# Patient Record
Sex: Female | Born: 1960 | Race: White | Hispanic: No | Smoking: Former smoker
Health system: Southern US, Community
[De-identification: ages and names within clinical notes are randomized; demographics above are authoritative.]

## PROBLEM LIST (undated history)

## (undated) DIAGNOSIS — J3081 Allergic rhinitis due to animal (cat) (dog) hair and dander: Secondary | ICD-10-CM

## (undated) DIAGNOSIS — Z8619 Personal history of other infectious and parasitic diseases: Secondary | ICD-10-CM

## (undated) DIAGNOSIS — Z5189 Encounter for other specified aftercare: Secondary | ICD-10-CM

## (undated) DIAGNOSIS — G43909 Migraine, unspecified, not intractable, without status migrainosus: Secondary | ICD-10-CM

## (undated) DIAGNOSIS — G8929 Other chronic pain: Secondary | ICD-10-CM

## (undated) DIAGNOSIS — R5383 Other fatigue: Secondary | ICD-10-CM

## (undated) DIAGNOSIS — F32A Depression, unspecified: Secondary | ICD-10-CM

## (undated) DIAGNOSIS — T7840XA Allergy, unspecified, initial encounter: Secondary | ICD-10-CM

## (undated) DIAGNOSIS — R519 Headache, unspecified: Secondary | ICD-10-CM

## (undated) DIAGNOSIS — F509 Eating disorder, unspecified: Secondary | ICD-10-CM

## (undated) DIAGNOSIS — D649 Anemia, unspecified: Secondary | ICD-10-CM

## (undated) DIAGNOSIS — R51 Headache: Secondary | ICD-10-CM

## (undated) DIAGNOSIS — F329 Major depressive disorder, single episode, unspecified: Secondary | ICD-10-CM

## (undated) DIAGNOSIS — R32 Unspecified urinary incontinence: Secondary | ICD-10-CM

## (undated) DIAGNOSIS — F419 Anxiety disorder, unspecified: Secondary | ICD-10-CM

## (undated) DIAGNOSIS — K589 Irritable bowel syndrome without diarrhea: Secondary | ICD-10-CM

## (undated) DIAGNOSIS — K219 Gastro-esophageal reflux disease without esophagitis: Secondary | ICD-10-CM

## (undated) HISTORY — DX: Migraine, unspecified, not intractable, without status migrainosus: G43.909

## (undated) HISTORY — DX: Headache, unspecified: R51.9

## (undated) HISTORY — DX: Major depressive disorder, single episode, unspecified: F32.9

## (undated) HISTORY — DX: Eating disorder, unspecified: F50.9

## (undated) HISTORY — DX: Headache: R51

## (undated) HISTORY — DX: Anemia, unspecified: D64.9

## (undated) HISTORY — DX: Irritable bowel syndrome, unspecified: K58.9

## (undated) HISTORY — DX: Encounter for other specified aftercare: Z51.89

## (undated) HISTORY — DX: Anxiety disorder, unspecified: F41.9

## (undated) HISTORY — DX: Depression, unspecified: F32.A

## (undated) HISTORY — DX: Other fatigue: R53.83

## (undated) HISTORY — DX: Personal history of other infectious and parasitic diseases: Z86.19

## (undated) HISTORY — DX: Unspecified urinary incontinence: R32

## (undated) HISTORY — DX: Gastro-esophageal reflux disease without esophagitis: K21.9

## (undated) HISTORY — DX: Allergic rhinitis due to animal (cat) (dog) hair and dander: J30.81

## (undated) HISTORY — DX: Allergy, unspecified, initial encounter: T78.40XA

## (undated) HISTORY — PX: DILATION AND CURETTAGE OF UTERUS: SHX78

## (undated) HISTORY — DX: Other chronic pain: G89.29

---

## 2003-05-21 ENCOUNTER — Other Ambulatory Visit: Admission: RE | Admit: 2003-05-21 | Discharge: 2003-05-21 | Payer: Self-pay | Admitting: *Deleted

## 2006-03-17 ENCOUNTER — Encounter: Admission: RE | Admit: 2006-03-17 | Discharge: 2006-03-17 | Payer: Self-pay | Admitting: Neurology

## 2008-07-13 ENCOUNTER — Ambulatory Visit (HOSPITAL_COMMUNITY): Admission: RE | Admit: 2008-07-13 | Discharge: 2008-07-13 | Payer: Self-pay | Admitting: Internal Medicine

## 2008-12-06 ENCOUNTER — Encounter (HOSPITAL_COMMUNITY): Admission: RE | Admit: 2008-12-06 | Discharge: 2009-03-06 | Payer: Self-pay | Admitting: Internal Medicine

## 2009-12-16 ENCOUNTER — Encounter: Admission: RE | Admit: 2009-12-16 | Discharge: 2009-12-16 | Payer: Self-pay | Admitting: Neurology

## 2011-01-27 LAB — CROSSMATCH: Antibody Screen: NEGATIVE

## 2011-01-27 LAB — ABO/RH: ABO/RH(D): B POS

## 2011-10-13 HISTORY — PX: OTHER SURGICAL HISTORY: SHX169

## 2012-12-30 ENCOUNTER — Encounter: Payer: Self-pay | Admitting: Internal Medicine

## 2013-01-12 ENCOUNTER — Encounter: Payer: Self-pay | Admitting: Internal Medicine

## 2013-01-17 ENCOUNTER — Encounter: Payer: Self-pay | Admitting: Internal Medicine

## 2013-01-17 ENCOUNTER — Ambulatory Visit (INDEPENDENT_AMBULATORY_CARE_PROVIDER_SITE_OTHER): Payer: Federal, State, Local not specified - PPO | Admitting: Internal Medicine

## 2013-01-17 VITALS — BP 118/66 | HR 75 | Ht 68.0 in | Wt 267.0 lb

## 2013-01-17 DIAGNOSIS — R1011 Right upper quadrant pain: Secondary | ICD-10-CM

## 2013-01-17 DIAGNOSIS — R1310 Dysphagia, unspecified: Secondary | ICD-10-CM

## 2013-01-17 DIAGNOSIS — F339 Major depressive disorder, recurrent, unspecified: Secondary | ICD-10-CM | POA: Insufficient documentation

## 2013-01-17 DIAGNOSIS — K589 Irritable bowel syndrome without diarrhea: Secondary | ICD-10-CM

## 2013-01-17 DIAGNOSIS — G43009 Migraine without aura, not intractable, without status migrainosus: Secondary | ICD-10-CM | POA: Insufficient documentation

## 2013-01-17 DIAGNOSIS — Z1211 Encounter for screening for malignant neoplasm of colon: Secondary | ICD-10-CM

## 2013-01-17 MED ORDER — ALIGN PO CAPS: 1.0000 | ORAL_CAPSULE | Freq: Every day | ORAL | Status: DC

## 2013-01-17 MED ORDER — PEG-KCL-NACL-NASULF-NA ASC-C 100 G PO SOLR: 1.0000 | Freq: Once | ORAL | Status: DC

## 2013-01-17 MED ORDER — PANTOPRAZOLE SODIUM 40 MG PO TBEC
40.0000 mg | DELAYED_RELEASE_TABLET | Freq: Every day | ORAL | Status: DC
Start: 2013-01-17 — End: 2014-03-01

## 2013-01-17 NOTE — Patient Instructions (Addendum)
You have been scheduled for a colonoscopy/endoscopy with propofol. Please follow written instructions given to you at your visit today.  Please pick up your prep kit at the pharmacy within the next 1-3 days. If you use inhalers (even only as needed), please bring them with you on the day of your procedure.  We have sent the following medications to your pharmacy for you to pick up at your convenience: Pantoprazole, Align  We have given you samples of Align. This puts good bacteria back into your intestines. You should take 1 capsule by mouth once daily. If this works well for you, it can be purchased over the counter.                                                We are excited to introduce MyChart, a new best-in-class service that provides you online access to important information in your electronic medical record. We want to make it easier for you to view your health information - all in one secure location - when and where you need it. We expect MyChart will enhance the quality of care and service we provide.  When you register for MyChart, you can:    View your test results.    Request appointments and receive appointment reminders via email.    Request medication renewals.    View your medical history, allergies, medications and immunizations.    Communicate with your physician's office through a password-protected site.    Conveniently print information such as your medication lists.  To find out if MyChart is right for you, please talk to a member of our clinical staff today. We will gladly answer your questions about this free health and wellness tool.  If you are age 86 or older and want a member of your family to have access to your record, you must provide written consent by completing a proxy form available at our office. Please speak to our clinical staff about guidelines regarding accounts for patients younger than age 55.  As you activate your MyChart account and need  any technical assistance, please call the MyChart technical support line at [phone]) or email your question to [email or web address]. If you email your question(s), please include your name, a return phone number and the best time to reach you.  If you have non-urgent health-related questions, you can send a message to our office through MyChart at [email or web address]. If you have a medical emergency, call 911.  Thank you for using MyChart as your new health and wellness resource!   MyChart licensed from [employer],  2956-2130. Patents Pending.

## 2013-01-17 NOTE — Progress Notes (Signed)
Patient ID: FLOREEN TEEGARDEN, female   DOB: June 25, 1961, 52 y.o.   MRN: [id number] HPI: Mrs. Roppolo is a 52 year old female with a past medical history of anxiety depression, chronic headaches and migraines who is seen in consultation at the request of Dr. Oneta Rack for evaluation of dysphagia, upper abd pain.  The patient reports one year of increasing solid food dysphagia. She has no trouble with liquids. This is worse with foods such as meats and apples. She reports on and off heartburn, but does not take anything specifically for this. She was prescribed Nexium recently by PCP but never started it. She also reports intermittent trouble with nausea. This bothers her it can last one to 2 weeks. Is not associated with vomiting. She also reports epigastric and upper abdominal pain which she described as an aching.  This is specifically worse with food, particularly fatty food. This symptom is often worse at night. It has woken her from sleep.  She reports a long-standing history of intermittent and alternating diarrhea with constipation. Neither diarrhea nor constipation are predominant for her. She denies rectal bleeding or melena. At times she has lower back cramping before bowel movements, often relieved with bowel movements. She does take NSAIDs for migraine headaches. No fevers or chills. No weight loss.  She has never had an endoscopic procedure  Of note she reports a history of brain lesions being followed by serial MRI. The diagnosis of MS has been considered but never formally made. She reports these lesions have been stable over time  Past Medical History  Diagnosis Date  . allergies   . Depression   . Irritable bowel syndrome (IBS)   . Fatigue   . Anemia   . Anxiety   . Chronic headaches     Past Surgical History  Procedure Laterality Date  . Cesarean section  1996    Current Outpatient Prescriptions  Medication Sig Dispense Refill  . cholecalciferol (VITAMIN D) 1000 UNITS tablet Take  1,000 Units by mouth 2 (two) times daily.      . fish oil-omega-3 fatty acids 1000 MG capsule Take 2 g by mouth daily.      Marland Kitchen FLUoxetine (PROZAC) 40 MG capsule Take 40 mg by mouth daily.      Marland Kitchen guaiFENesin (MUCINEX) 600 MG 12 hr tablet Take 1,200 mg by mouth as needed for congestion.      Marland Kitchen loratadine (CLARITIN) 10 MG tablet Take 10 mg by mouth daily.      . mometasone (NASONEX) 50 MCG/ACT nasal spray Place 2 sprays into the nose daily.      Marland Kitchen topiramate (TOPAMAX) 100 MG tablet Take 50 mg by mouth daily.       . bifidobacterium infantis (ALIGN) capsule Take 1 capsule by mouth daily.  14 capsule  0  . pantoprazole (PROTONIX) 40 MG tablet Take 1 tablet (40 mg total) by mouth daily.  30 tablet  6  . peg 3350 powder (MOVIPREP) 100 G SOLR Take 1 kit (100 g total) by mouth once.  1 kit  0   No current facility-administered medications for this visit.    Allergies  Allergen Reactions  . Penicillins Rash    Family History  Problem Relation Age of Onset  . Heart disease Father   . Heart disease Paternal Aunt   . Heart disease Paternal Grandmother   . Lung cancer Paternal Grandfather   . Lung cancer Maternal Grandfather   . Lung cancer Paternal Grandmother   . Kidney cancer  Maternal Grandmother     History  Substance Use Topics  . Smoking status: Former Games developer  . Smokeless tobacco: Never Used  . Alcohol Use: No    ROS: As per history of present illness, otherwise negative  BP 118/66  Pulse 75  Ht 5\' 8"  (1.727 m)  Wt 267 lb (121.11 kg)  BMI 40.61 kg/m2  LMP 09/25/2012 Constitutional: Well-developed and well-nourished. No distress. HEENT: Normocephalic and atraumatic. Oropharynx is clear and moist. No oropharyngeal exudate. Conjunctivae are normal.  No scleral icterus. Neck: Neck supple. Trachea midline. Cardiovascular: Normal rate, regular rhythm and intact distal pulses. No M/R/G Pulmonary/chest: Effort normal and breath sounds normal. No wheezing, rales or  rhonchi. Abdominal: Soft, nontender, nondistended. Bowel sounds active throughout. There are no masses palpable. No hepatosplenomegaly. Extremities: no clubbing, cyanosis, or edema Lymphadenopathy: No cervical adenopathy noted. Neurological: Alert and oriented to person place and time. Skin: Skin is warm and dry. No rashes noted. Psychiatric: Normal mood and affect. Behavior is normal.  RELEVANT LABS  Labs dated 10/28/2012 CBC within normal limits Basic metabolic panel within normal limits Liver profile within normal limits TSH normal H pylori antibody negative Hemoglobin A1c 5.6 Vitamin D low at 13 UA unremarkable  ASSESSMENT/PLAN:  52 year old female with a past medical history of anxiety depression, chronic headaches and migraines who is seen in consultation at the request of Dr. Oneta Rack for evaluation of dysphagia, upper abd pain.   1.  Epigastric abdominal pain/nausea -- peptic ulcer disease is in the differential along with biliary colic. She never started her PPI, and I recommend a trial of pantoprazole 40 mg daily. She is instructed to take this 30 minutes to one hour before her first meal of the day.  Also recommended upper endoscopy for further evaluation of this pain. We discussed the tests today including risks and benefits and she is agreeable to proceed. If her upper endoscopy is negative, I recommend ultrasound to exclude biliary pathology such as stones  2.  Alternating diarrhea and constipation/IBS -- many of her symptoms are consistent with irritable bowel syndrome. I like her to try Align 1 capsule daily as a probiotic to help regulate her digestion. Rule out celiac disease with her upcoming upper endoscopy. Also, she will have a colonoscopy for screening, see #3.  If colonoscopy is normal, I would consider an anti-spasmodic and/or FODMAP diet  3.  CRC screening -- she is 52 years old, and average risk from colorectal cancer screening standpoint. We discussed colonoscopy  today and she is agreeable to proceed. This will be performed on the same day as her endoscopy

## 2013-02-09 ENCOUNTER — Encounter: Payer: Federal, State, Local not specified - PPO | Admitting: Internal Medicine

## 2013-05-19 ENCOUNTER — Emergency Department: Payer: Self-pay | Admitting: Emergency Medicine

## 2013-05-22 ENCOUNTER — Telehealth: Payer: Self-pay | Admitting: Gastroenterology

## 2013-05-22 ENCOUNTER — Other Ambulatory Visit: Payer: Self-pay | Admitting: Gastroenterology

## 2013-05-22 DIAGNOSIS — R1011 Right upper quadrant pain: Secondary | ICD-10-CM

## 2013-05-22 DIAGNOSIS — Z1211 Encounter for screening for malignant neoplasm of colon: Secondary | ICD-10-CM

## 2013-05-22 DIAGNOSIS — R1310 Dysphagia, unspecified: Secondary | ICD-10-CM

## 2013-05-22 NOTE — Telephone Encounter (Signed)
Pt needs prior authorization for pantoprazole. Faxed in for to [facility] [phone]

## 2014-03-01 ENCOUNTER — Ambulatory Visit (INDEPENDENT_AMBULATORY_CARE_PROVIDER_SITE_OTHER): Payer: Federal, State, Local not specified - PPO | Admitting: Family Medicine

## 2014-03-01 ENCOUNTER — Encounter (INDEPENDENT_AMBULATORY_CARE_PROVIDER_SITE_OTHER): Payer: Self-pay

## 2014-03-01 ENCOUNTER — Encounter: Payer: Self-pay | Admitting: Family Medicine

## 2014-03-01 VITALS — BP 116/70 | HR 77 | Temp 98.5°F | Ht 66.46 in | Wt 268.2 lb

## 2014-03-01 DIAGNOSIS — N393 Stress incontinence (female) (male): Secondary | ICD-10-CM

## 2014-03-01 DIAGNOSIS — D219 Benign neoplasm of connective and other soft tissue, unspecified: Secondary | ICD-10-CM

## 2014-03-01 DIAGNOSIS — R0600 Dyspnea, unspecified: Secondary | ICD-10-CM

## 2014-03-01 DIAGNOSIS — J309 Allergic rhinitis, unspecified: Secondary | ICD-10-CM | POA: Insufficient documentation

## 2014-03-01 DIAGNOSIS — Z13220 Encounter for screening for lipoid disorders: Secondary | ICD-10-CM

## 2014-03-01 DIAGNOSIS — R0609 Other forms of dyspnea: Secondary | ICD-10-CM | POA: Insufficient documentation

## 2014-03-01 DIAGNOSIS — Z8249 Family history of ischemic heart disease and other diseases of the circulatory system: Secondary | ICD-10-CM

## 2014-03-01 DIAGNOSIS — K219 Gastro-esophageal reflux disease without esophagitis: Secondary | ICD-10-CM

## 2014-03-01 DIAGNOSIS — R5383 Other fatigue: Secondary | ICD-10-CM

## 2014-03-01 DIAGNOSIS — F411 Generalized anxiety disorder: Secondary | ICD-10-CM

## 2014-03-01 DIAGNOSIS — F410 Panic disorder [episodic paroxysmal anxiety] without agoraphobia: Secondary | ICD-10-CM

## 2014-03-01 DIAGNOSIS — D259 Leiomyoma of uterus, unspecified: Secondary | ICD-10-CM

## 2014-03-01 DIAGNOSIS — G43009 Migraine without aura, not intractable, without status migrainosus: Secondary | ICD-10-CM

## 2014-03-01 DIAGNOSIS — F339 Major depressive disorder, recurrent, unspecified: Secondary | ICD-10-CM

## 2014-03-01 DIAGNOSIS — R0989 Other specified symptoms and signs involving the circulatory and respiratory systems: Secondary | ICD-10-CM

## 2014-03-01 DIAGNOSIS — R5381 Other malaise: Secondary | ICD-10-CM

## 2014-03-01 NOTE — Progress Notes (Signed)
Pre visit review using our clinic review tool, if applicable. No additional management support is needed unless otherwise documented below in the visit note. 

## 2014-03-01 NOTE — Progress Notes (Signed)
   Subjective:    Patient ID: Lori Kaiser, female    DOB: 07-30-1961, 53 y.o.   MRN: [id number]  HPI 53 year old female presents to establish care at our office.  She was seeing Dr. Johnnye Sima in past. Last OV  over 1-2 years for CPX.   She had seen GYN over 5 years for CPX. Plans to return.  Due for Tdap, colonoscopy, mammo.  GAD, major depression, panic attacks since 20s. Well controlled on prozac. Prescribed by her neurologist.  Common migraine: Followed by Dr. Nancy Nordmann , neuro. Appts every 3-4 months.  On topamax for prevention, uses flurbiprofen for rescue.  Now ongoing 1-2 per month.  GERD: well controlled on trigger avoidance  Father with CAD age 60s.  Last EKG 15 years ago. She has never had stress test in past. No chest pain, DOE given she is deconditioned. No palpitations.  She has fear given her fathers history that she will have MI.  Fatigue ongoing for last 6-8 months.  She has soreness in legs bilaterally, not in joints. She feels it is from lack of exercise.   Review of Systems  Constitutional: Positive for fatigue.  HENT: Negative for ear pain.   Eyes: Negative for pain.  Respiratory: Positive for shortness of breath. Negative for cough and wheezing.   Cardiovascular: Negative for chest pain.  Gastrointestinal: Negative for abdominal pain.       Objective:   Physical Exam        Assessment & Plan:

## 2014-03-01 NOTE — Assessment & Plan Note (Signed)
Moderate control on prozac.

## 2014-03-01 NOTE — Patient Instructions (Addendum)
Schedule  wellness, no pap/breast with labs prior in next month. Schedule appt with GYN as planned for breast and pelvic exam.  We will call with lab results and consider exercise stress test.

## 2014-03-01 NOTE — Assessment & Plan Note (Signed)
Eval with labs. 

## 2014-03-01 NOTE — Assessment & Plan Note (Addendum)
Eval with labs. Will eval with EKG given familiy hx of early CAD. NORMAL EKG.  Consider exercise stress test if labs neg for work up and to clear her for exercise.

## 2014-03-01 NOTE — Assessment & Plan Note (Signed)
Well controlled 

## 2014-03-01 NOTE — Addendum Note (Signed)
Addended by: Ellamae Sia on: 03/01/2014 10:05 AM   Modules accepted: Orders

## 2014-03-06 ENCOUNTER — Other Ambulatory Visit (INDEPENDENT_AMBULATORY_CARE_PROVIDER_SITE_OTHER): Payer: Federal, State, Local not specified - PPO

## 2014-03-06 DIAGNOSIS — R5381 Other malaise: Secondary | ICD-10-CM

## 2014-03-06 DIAGNOSIS — Z13220 Encounter for screening for lipoid disorders: Secondary | ICD-10-CM

## 2014-03-06 DIAGNOSIS — F339 Major depressive disorder, recurrent, unspecified: Secondary | ICD-10-CM

## 2014-03-06 DIAGNOSIS — R5383 Other fatigue: Secondary | ICD-10-CM

## 2014-03-06 DIAGNOSIS — F411 Generalized anxiety disorder: Secondary | ICD-10-CM

## 2014-03-06 DIAGNOSIS — G43009 Migraine without aura, not intractable, without status migrainosus: Secondary | ICD-10-CM

## 2014-03-06 LAB — CBC WITH DIFFERENTIAL/PLATELET
BASOS ABS: 0 10*3/uL (ref 0.0–0.1)
BASOS PCT: 0.6 % (ref 0.0–3.0)
EOS PCT: 4.5 % (ref 0.0–5.0)
Eosinophils Absolute: 0.3 10*3/uL (ref 0.0–0.7)
HCT: 38.5 % (ref 36.0–46.0)
HEMOGLOBIN: 12.8 g/dL (ref 12.0–15.0)
LYMPHS ABS: 1.9 10*3/uL (ref 0.7–4.0)
LYMPHS PCT: 23.8 % (ref 12.0–46.0)
MCHC: 33.2 g/dL (ref 30.0–36.0)
MCV: 86.2 fl (ref 78.0–100.0)
MONO ABS: 0.4 10*3/uL (ref 0.1–1.0)
Monocytes Relative: 4.8 % (ref 3.0–12.0)
Neutro Abs: 5.2 10*3/uL (ref 1.4–7.7)
Neutrophils Relative %: 66.3 % (ref 43.0–77.0)
Platelets: 279 10*3/uL (ref 150.0–400.0)
RBC: 4.47 Mil/uL (ref 3.87–5.11)
RDW: 13.8 % (ref 11.5–15.5)
WBC: 7.8 10*3/uL (ref 4.0–10.5)

## 2014-03-06 LAB — COMPREHENSIVE METABOLIC PANEL
ALK PHOS: 105 U/L (ref 39–117)
ALT: 25 U/L (ref 0–35)
AST: 23 U/L (ref 0–37)
Albumin: 3.6 g/dL (ref 3.5–5.2)
BUN: 8 mg/dL (ref 6–23)
CALCIUM: 9.3 mg/dL (ref 8.4–10.5)
CO2: 23 mEq/L (ref 19–32)
Chloride: 105 mEq/L (ref 96–112)
Creatinine, Ser: 0.9 mg/dL (ref 0.4–1.2)
GFR: 74.89 mL/min (ref >60.00)
GLUCOSE: 97 mg/dL (ref 70–99)
Potassium: 3.8 mEq/L (ref 3.5–5.1)
SODIUM: 138 meq/L (ref 135–145)
Total Bilirubin: 0.5 mg/dL (ref 0.2–1.2)
Total Protein: 7 g/dL (ref 6.0–8.3)

## 2014-03-06 LAB — LIPID PANEL
CHOL/HDL RATIO: 4
Cholesterol: 181 mg/dL (ref 0–200)
HDL: 47.3 mg/dL (ref >39.00)
LDL CALC: 99 mg/dL (ref 0–99)
Triglycerides: 172 mg/dL — ABNORMAL HIGH (ref 0.0–149.0)
VLDL: 34.4 mg/dL (ref 0.0–40.0)

## 2014-03-06 LAB — VITAMIN B12: Vitamin B-12: 275 pg/mL (ref 211–911)

## 2014-03-06 LAB — TSH: TSH: 1.89 u[IU]/mL (ref 0.35–4.50)

## 2014-03-07 LAB — VITAMIN D 25 HYDROXY (VIT D DEFICIENCY, FRACTURES): VIT D 25 HYDROXY: 32 ng/mL (ref 30–89)

## 2014-03-30 ENCOUNTER — Other Ambulatory Visit: Payer: Federal, State, Local not specified - PPO

## 2014-04-06 ENCOUNTER — Ambulatory Visit (INDEPENDENT_AMBULATORY_CARE_PROVIDER_SITE_OTHER): Payer: Federal, State, Local not specified - PPO | Admitting: Family Medicine

## 2014-04-06 ENCOUNTER — Encounter: Payer: Self-pay | Admitting: Family Medicine

## 2014-04-06 VITALS — BP 112/62 | HR 72 | Temp 98.8°F | Ht 66.5 in | Wt 268.8 lb

## 2014-04-06 DIAGNOSIS — R0609 Other forms of dyspnea: Secondary | ICD-10-CM

## 2014-04-06 DIAGNOSIS — Z0000 Encounter for general adult medical examination without abnormal findings: Secondary | ICD-10-CM

## 2014-04-06 DIAGNOSIS — R5383 Other fatigue: Secondary | ICD-10-CM

## 2014-04-06 DIAGNOSIS — R0600 Dyspnea, unspecified: Secondary | ICD-10-CM

## 2014-04-06 DIAGNOSIS — R5381 Other malaise: Secondary | ICD-10-CM

## 2014-04-06 DIAGNOSIS — R0989 Other specified symptoms and signs involving the circulatory and respiratory systems: Secondary | ICD-10-CM

## 2014-04-06 NOTE — Progress Notes (Signed)
Subjective:    Patient ID: Lori Kaiser, female    DOB: 04/17/1961, 53 y.o.   MRN: [id number]  HPI The patient is here for annual wellness exam and preventative care.     GAD, major depression, panic attacks since 20s. Well controlled on prozac. Prescribed by her neurologist.   Common migraine: Followed by Dr. Nancy Nordmann , neuro. Appts every 3-4 months. On topamax for prevention, uses flurbiprofen for rescue. Now ongoing 1-2 per month.   GERD: well controlled on trigger avoidance   Father with CAD age 9s. Last EKG 15 years ago. She has never had stress test in past.  No chest pain, DOE given she is deconditioned.  No palpitations.  She has fear given her fathers history that she will have MI.   Reviewed labs in detail with pt.  Lab Results  Component Value Date   CHOL 181 03/06/2014   HDL 47.30 03/06/2014   LDLCALC 99 03/06/2014   TRIG 172.0* 03/06/2014   CHOLHDL 4 03/06/2014     Fatigue ongoing for last 6-8 months.  DOE EKG neg, nml CMET, TSH, vit D, vit B12  at last OV 1 month ago for theses issues.  She reports she has been feeling better overall, more energy. NO DOE, this was from deconditioning.  She has less " brain fog."  She has been exercising 1 mile a day on treadmill. She has improved leg soreness. Wt Readings from Last 3 Encounters:  04/06/14 268 lb 12 oz (121.904 kg)  03/01/14 268 lb 4 oz (121.677 kg)  01/17/13 267 lb (121.11 kg)   BP Readings from Last 3 Encounters:  04/06/14 112/62  03/01/14 116/70  01/17/13 118/66      Review of Systems  Constitutional: Negative for fever and fatigue.  HENT: Negative for ear pain.   Eyes: Negative for pain.  Respiratory: Negative for chest tightness and shortness of breath.   Cardiovascular: Negative for chest pain, palpitations and leg swelling.  Gastrointestinal: Negative for abdominal pain.  Genitourinary: Negative for dysuria.       Objective:   Physical Exam  Constitutional: Vital signs are normal.  She appears well-developed and well-nourished. She is cooperative.  Non-toxic appearance. She does not appear ill. No distress.  obese  HENT:  Head: Normocephalic.  Right Ear: Hearing, tympanic membrane, external ear and ear canal normal. Tympanic membrane is not erythematous, not retracted and not bulging.  Left Ear: Hearing, tympanic membrane, external ear and ear canal normal. Tympanic membrane is not erythematous, not retracted and not bulging.  Nose: No mucosal edema or rhinorrhea. Right sinus exhibits no maxillary sinus tenderness and no frontal sinus tenderness. Left sinus exhibits no maxillary sinus tenderness and no frontal sinus tenderness.  Mouth/Throat: Uvula is midline, oropharynx is clear and moist and mucous membranes are normal.  Eyes: Conjunctivae, EOM and lids are normal. Pupils are equal, round, and reactive to light. Lids are everted and swept, no foreign bodies found.  Neck: Trachea normal and normal range of motion. Neck supple. Carotid bruit is not present. No mass and no thyromegaly present.  Cardiovascular: Normal rate, regular rhythm, S1 normal, S2 normal, normal heart sounds, intact distal pulses and normal pulses.  Exam reveals no gallop and no friction rub.   No murmur heard. Pulmonary/Chest: Effort normal and breath sounds normal. Not tachypneic. No respiratory distress. She has no decreased breath sounds. She has no wheezes. She has no rhonchi. She has no rales.  Abdominal: Soft. Normal appearance and bowel sounds  are normal. There is no tenderness.  Genitourinary:  Per GYN  Neurological: She is alert.  Skin: Skin is warm, dry and intact. No rash noted.  Psychiatric: Her speech is normal and behavior is normal. Judgment and thought content normal. Her mood appears not anxious. Cognition and memory are normal. She does not exhibit a depressed mood.          Assessment & Plan:  The patient's preventative maintenance and recommended screening tests for an annual  wellness exam were reviewed in full today. Brought up to date unless services declined.  Counselled on the importance of diet, exercise, and its role in overall health and mortality. The patient's FH and SH was reviewed, including their home life, tobacco status, and drug and alcohol status.    Due for Tdap she will consider. She has never had a colonoscopy,  She plans to schedule with Dr. Hilarie Fredrickson. Sees Dr. Ronita Hipps for mammo/DVE/pap Former smoker for 12 years, Quit remotely. No early osteoporosis.

## 2014-04-06 NOTE — Assessment & Plan Note (Signed)
Resolved with exercise. Lab eval normal.

## 2014-04-06 NOTE — Patient Instructions (Signed)
Schedule CPX with labs prior in 1 year.  keep up the great work with healthy eating and exercise!!

## 2014-04-06 NOTE — Progress Notes (Signed)
Pre visit review using our clinic review tool, if applicable. No additional management support is needed unless otherwise documented below in the visit note. 

## 2014-04-06 NOTE — Assessment & Plan Note (Signed)
Resolved with exercise

## 2014-11-23 ENCOUNTER — Ambulatory Visit (INDEPENDENT_AMBULATORY_CARE_PROVIDER_SITE_OTHER): Payer: Federal, State, Local not specified - PPO | Admitting: Family Medicine

## 2014-11-23 ENCOUNTER — Encounter: Payer: Self-pay | Admitting: Family Medicine

## 2014-11-23 VITALS — BP 134/87 | HR 95 | Temp 98.4°F | Ht 66.5 in | Wt 287.5 lb

## 2014-11-23 DIAGNOSIS — J029 Acute pharyngitis, unspecified: Secondary | ICD-10-CM

## 2014-11-23 DIAGNOSIS — B9789 Other viral agents as the cause of diseases classified elsewhere: Principal | ICD-10-CM

## 2014-11-23 DIAGNOSIS — R509 Fever, unspecified: Secondary | ICD-10-CM

## 2014-11-23 DIAGNOSIS — J069 Acute upper respiratory infection, unspecified: Secondary | ICD-10-CM | POA: Insufficient documentation

## 2014-11-23 LAB — POCT INFLUENZA A/B
Influenza A, POC: NEGATIVE
Influenza B, POC: NEGATIVE

## 2014-11-23 LAB — POCT RAPID STREP A (OFFICE): Rapid Strep A Screen: NEGATIVE

## 2014-11-23 MED ORDER — GUAIFENESIN-CODEINE 100-10 MG/5ML PO SYRP: 5.0000 mL | ORAL_SOLUTION | Freq: Every evening | ORAL | Status: DC | PRN

## 2014-11-23 NOTE — Progress Notes (Signed)
   Subjective:    Patient ID: Lori Kaiser, female    DOB: 1961-08-21, 54 y.o.   MRN: [id number]  HPI Comments: Mild SOB.  URI  This is a new problem. The current episode started in the past 7 days (2 DAYS). The maximum temperature recorded prior to her arrival was 102 - 102.9 F. The fever has been present for 1 to 2 days. Associated symptoms include coughing, ear pain, headaches, a plugged ear sensation and a sore throat. Pertinent negatives include no chest pain, congestion, dysuria, neck pain, rash, sinus pain, sneezing or wheezing. Associated symptoms comments: Wheeze, mild SOB  right sore throat and right ear. She has tried NSAIDs (mucinex) for the symptoms. The treatment provided moderate relief.  Fever  Associated symptoms include coughing, ear pain, headaches and a sore throat. Pertinent negatives include no chest pain, congestion, rash, urinary pain or wheezing.   Son with strep recently.  Review of Systems  Constitutional: Positive for fever.  HENT: Positive for ear pain and sore throat. Negative for congestion and sneezing.   Respiratory: Positive for cough. Negative for wheezing.   Cardiovascular: Negative for chest pain.  Genitourinary: Negative for dysuria.  Musculoskeletal: Negative for neck pain.  Skin: Negative for rash.  Neurological: Positive for headaches.       Objective:   Physical Exam  Constitutional: Vital signs are normal. She appears well-developed and well-nourished. She is cooperative.  Non-toxic appearance. She does not appear ill. No distress.  HENT:  Head: Normocephalic.  Right Ear: Hearing, external ear and ear canal normal. Tympanic membrane is not erythematous, not retracted and not bulging. A middle ear effusion is present.  Left Ear: Hearing, external ear and ear canal normal. Tympanic membrane is not erythematous, not retracted and not bulging. A middle ear effusion is present.  Nose: Mucosal edema and rhinorrhea present. Right sinus exhibits no  maxillary sinus tenderness and no frontal sinus tenderness. Left sinus exhibits no maxillary sinus tenderness and no frontal sinus tenderness.  Mouth/Throat: Uvula is midline and mucous membranes are normal. Posterior oropharyngeal erythema present. No oropharyngeal exudate, posterior oropharyngeal edema or tonsillar abscesses.  Eyes: Conjunctivae, EOM and lids are normal. Pupils are equal, round, and reactive to light. Lids are everted and swept, no foreign bodies found.  Neck: Trachea normal and normal range of motion. Neck supple. Carotid bruit is not present. No thyroid mass and no thyromegaly present.  Cardiovascular: Normal rate, regular rhythm, S1 normal, S2 normal, normal heart sounds, intact distal pulses and normal pulses.  Exam reveals no gallop and no friction rub.   No murmur heard. Pulmonary/Chest: Effort normal and breath sounds normal. No tachypnea. No respiratory distress. She has no decreased breath sounds. She has no wheezes. She has no rhonchi. She has no rales.  Neurological: She is alert.  Skin: Skin is warm, dry and intact. No rash noted.  Psychiatric: Her speech is normal and behavior is normal. Judgment normal. Her mood appears not anxious. Cognition and memory are normal. She does not exhibit a depressed mood.          Assessment & Plan:

## 2014-11-23 NOTE — Addendum Note (Signed)
Addended by: Carter Kitten on: 11/23/2014 12:34 PM   Modules accepted: Orders

## 2014-11-23 NOTE — Progress Notes (Signed)
Pre visit review using our clinic review tool, if applicable. No additional management support is needed unless otherwise documented below in the visit note. 

## 2014-11-23 NOTE — Assessment & Plan Note (Signed)
Neg strep (performed given recent exposure) and flu. Treat with mucinex DM in day, cough suppressant at night.  Rest. Fluids.  NSAIDs for fever.  Call if not improving in next 7-10 days, go to ER if severe shortness of breath.

## 2014-11-23 NOTE — Patient Instructions (Signed)
Treat with mucinex DM in day,  Prescription cough suppressant at night.  Rest. Fluids.  NSAIDs for fever.  Call if not improving in next 7-10 days, go to ER if severe shortness of breath.

## 2014-12-12 ENCOUNTER — Encounter: Payer: Self-pay | Admitting: Internal Medicine

## 2014-12-12 ENCOUNTER — Ambulatory Visit (INDEPENDENT_AMBULATORY_CARE_PROVIDER_SITE_OTHER): Payer: Federal, State, Local not specified - PPO | Admitting: Internal Medicine

## 2014-12-12 VITALS — BP 138/78 | HR 102 | Temp 98.8°F | Wt 286.0 lb

## 2014-12-12 DIAGNOSIS — R05 Cough: Secondary | ICD-10-CM

## 2014-12-12 DIAGNOSIS — R059 Cough, unspecified: Secondary | ICD-10-CM

## 2014-12-12 MED ORDER — BENZONATATE 200 MG PO CAPS: 200.0000 mg | ORAL_CAPSULE | Freq: Two times a day (BID) | ORAL | Status: DC | PRN

## 2014-12-12 NOTE — Progress Notes (Signed)
Pre visit review using our clinic review tool, if applicable. No additional management support is needed unless otherwise documented below in the visit note. 

## 2014-12-12 NOTE — Patient Instructions (Signed)
Cough, Adult  A cough is a reflex that helps clear your throat and airways. It can help heal the body or may be a reaction to an irritated airway. A cough may only last 2 or 3 weeks (acute) or may last more than 8 weeks (chronic).  CAUSES Acute cough:  Viral or bacterial infections. Chronic cough:  Infections.  Allergies.  Asthma.  Post-nasal drip.  Smoking.  Heartburn or acid reflux.  Some medicines.  Chronic lung problems (COPD).  Cancer. SYMPTOMS   Cough.  Fever.  Chest pain.  Increased breathing rate.  High-pitched whistling sound when breathing (wheezing).  Colored mucus that you cough up (sputum). TREATMENT   A bacterial cough may be treated with antibiotic medicine.  A viral cough must run its course and will not respond to antibiotics.  Your caregiver may recommend other treatments if you have a chronic cough. HOME CARE INSTRUCTIONS   Only take over-the-counter or prescription medicines for pain, discomfort, or fever as directed by your caregiver. Use cough suppressants only as directed by your caregiver.  Use a cold steam vaporizer or humidifier in your bedroom or home to help loosen secretions.  Sleep in a semi-upright position if your cough is worse at night.  Rest as needed.  Stop smoking if you smoke. SEEK IMMEDIATE MEDICAL CARE IF:   You have pus in your sputum.  Your cough starts to worsen.  You cannot control your cough with suppressants and are losing sleep.  You begin coughing up blood.  You have difficulty breathing.  You develop pain which is getting worse or is uncontrolled with medicine.  You have a fever. MAKE SURE YOU:   Understand these instructions.  Will watch your condition.  Will get help right away if you are not doing well or get worse. Document Released: 03/27/2011 Document Revised: 12/21/2011 Document Reviewed: 03/27/2011 ExitCare Patient Information 2015 [employer]. This information is not intended  to replace advice given to you by your health care provider. Make sure you discuss any questions you have with your health care provider.  

## 2014-12-12 NOTE — Progress Notes (Signed)
HPI  Pt presents to the clinic today with c/o continue cough, sore throat, facial pressure and headache. She was seen for the same 11/23/14, diagnosed with Viral URI, advised to take Mucinex and cough suppressant. She was not feeling any better, so she went to [facility] 11/27/14, was given a zpack and prednisone taper. She finished both prescriptions and has noticed little improvement in her symptoms.  She still has headache, ear pressure, scratchy throat and cough. She is blowing clear mucous out of her nose. The cough is non productive. She denies fever, chills or body aches. She has been using Claritin but not the Flonase. She does have a history of allergies but denies breathing problems. She has not had sick contacts. She does not smoke. She did get her flu shot.  Review of Systems      Past Medical History  Diagnosis Date  . allergies   . Depression   . Irritable bowel syndrome (IBS)   . Fatigue   . Anemia   . Anxiety   . Chronic headaches   . History of chicken pox   . Eating disorder   . GERD (gastroesophageal reflux disease)   . Allergy   . Migraine   . Blood transfusion without reported diagnosis   . Urine incontinence     Family History  Problem Relation Age of Onset  . Heart disease Father 31  . Atrial fibrillation Father   . Heart disease Paternal Aunt   . Heart disease Paternal Grandmother   . Lung cancer Paternal Grandmother   . Stroke Paternal Grandmother   . Lung cancer Paternal Grandfather   . Lung cancer Maternal Grandfather   . Kidney cancer Maternal Grandmother     History   Social History  . Marital Status: Single    Spouse Name: N/A  . Number of Children: 2  . Years of Education: N/A   Occupational History  . Management and Program Analyst    Social History Main Topics  . Smoking status: Former Smoker -- 1.00 packs/day for 12 years    Types: Cigarettes  . Smokeless tobacco: Never Used  . Alcohol Use: No  . Drug Use: No  . Sexual Activity: No    Other Topics Concern  . Not on file   Social History Narrative   Divorced, twins age 65   Diet: moderate, water   Exercise: walks sporadically    Allergies  Allergen Reactions  . Penicillins Rash     Constitutional: Positive headache, fatigue. Denies fever or abrupt weight changes.  HEENT:  Positive facial pain, runny nose, sore throat. Denies eye redness, eye pain, pressure behind the eyes, nasal congestion, ear pain, ringing in the ears, wax buildup, or bloody nose. Respiratory: Positive cough. Denies difficulty breathing or shortness of breath.  Cardiovascular: Denies chest pain, chest tightness, palpitations or swelling in the hands or feet.   No other specific complaints in a complete review of systems (except as listed in HPI above).  Objective:   Pulse 102  Temp(Src) 98.8 F (37.1 C) (Oral)  Wt 286 lb (129.729 kg)  SpO2 98%  LMP 01/24/2014 Wt Readings from Last 3 Encounters:  12/12/14 286 lb (129.729 kg)  11/23/14 287 lb 8 oz (130.409 kg)  04/06/14 268 lb 12 oz (121.904 kg)     General: Appears her stated age, well developed, well nourished in NAD. HEENT: Head: normal shape and size, mild maxillary sinus tenderness noted; Eyes: sclera white, no icterus, conjunctiva pink; Ears: Tm's pink  but intact, normal light reflex; Throat/Mouth:  Teeth present, mucosa pink and moist, no exudate noted, no lesions or ulcerations noted.  Neck: No lymphadenopathy.   Cardiovascular: Tachycardic with normal rhythm. S1,S2 noted.  No murmur, rubs or gallops noted.  Pulmonary/Chest: Normal effort and positive vesicular breath sounds. No respiratory distress. No wheezes, rales or ronchi noted.      Assessment & Plan:   Post infectious cough:  Do salt water gargles for the sore throat Flonase should help with hoarseness Advised her the cough can last for 2-3 weeks after treatment eRx for Tessalon Pearls Ok to use Delsym or Nyquil at night if needed  RTC as needed or if  symptoms persist.

## 2015-05-06 ENCOUNTER — Telehealth: Payer: Self-pay

## 2015-05-06 NOTE — Telephone Encounter (Signed)
Called and spoke with patient, and notified them that they were due for a Mammogram. Patient wanted information on [facility]. Patient will call and schedule an appointment.

## 2016-11-10 DIAGNOSIS — K080 Exfoliation of teeth due to systemic causes: Secondary | ICD-10-CM | POA: Diagnosis not present

## 2017-01-25 DIAGNOSIS — H31103 Choroidal degeneration, unspecified, bilateral: Secondary | ICD-10-CM | POA: Diagnosis not present

## 2017-03-30 DIAGNOSIS — K080 Exfoliation of teeth due to systemic causes: Secondary | ICD-10-CM | POA: Diagnosis not present

## 2017-07-09 DIAGNOSIS — F329 Major depressive disorder, single episode, unspecified: Secondary | ICD-10-CM | POA: Diagnosis not present

## 2017-10-19 DIAGNOSIS — K080 Exfoliation of teeth due to systemic causes: Secondary | ICD-10-CM | POA: Diagnosis not present

## 2018-02-17 DIAGNOSIS — K080 Exfoliation of teeth due to systemic causes: Secondary | ICD-10-CM | POA: Diagnosis not present

## 2018-06-23 DIAGNOSIS — K080 Exfoliation of teeth due to systemic causes: Secondary | ICD-10-CM | POA: Diagnosis not present

## 2018-07-20 DIAGNOSIS — N941 Unspecified dyspareunia: Secondary | ICD-10-CM | POA: Diagnosis not present

## 2018-07-20 DIAGNOSIS — N952 Postmenopausal atrophic vaginitis: Secondary | ICD-10-CM | POA: Diagnosis not present

## 2018-07-20 DIAGNOSIS — N94810 Vulvar vestibulitis: Secondary | ICD-10-CM | POA: Diagnosis not present

## 2018-07-20 DIAGNOSIS — N905 Atrophy of vulva: Secondary | ICD-10-CM | POA: Diagnosis not present

## 2018-07-20 DIAGNOSIS — N942 Vaginismus: Secondary | ICD-10-CM | POA: Diagnosis not present

## 2018-07-28 ENCOUNTER — Encounter: Payer: Self-pay | Admitting: Primary Care

## 2018-07-28 ENCOUNTER — Ambulatory Visit: Payer: Federal, State, Local not specified - PPO | Admitting: Primary Care

## 2018-07-28 VITALS — BP 122/76 | HR 102 | Temp 98.2°F | Ht 66.25 in | Wt 271.5 lb

## 2018-07-28 DIAGNOSIS — J309 Allergic rhinitis, unspecified: Secondary | ICD-10-CM | POA: Diagnosis not present

## 2018-07-28 DIAGNOSIS — K219 Gastro-esophageal reflux disease without esophagitis: Secondary | ICD-10-CM | POA: Diagnosis not present

## 2018-07-28 DIAGNOSIS — G43009 Migraine without aura, not intractable, without status migrainosus: Secondary | ICD-10-CM

## 2018-07-28 DIAGNOSIS — Z23 Encounter for immunization: Secondary | ICD-10-CM | POA: Diagnosis not present

## 2018-07-28 DIAGNOSIS — K581 Irritable bowel syndrome with constipation: Secondary | ICD-10-CM | POA: Diagnosis not present

## 2018-07-28 DIAGNOSIS — F3342 Major depressive disorder, recurrent, in full remission: Secondary | ICD-10-CM

## 2018-07-28 DIAGNOSIS — F411 Generalized anxiety disorder: Secondary | ICD-10-CM

## 2018-07-28 MED ORDER — FAMOTIDINE 20 MG PO TABS: ORAL_TABLET | ORAL | 0 refills | Status: DC

## 2018-07-28 MED ORDER — TOPIRAMATE 50 MG PO TABS: 50.0000 mg | ORAL_TABLET | Freq: Every day | ORAL | 0 refills | Status: DC

## 2018-07-28 MED ORDER — FLUOXETINE HCL 20 MG PO TABS: 20.0000 mg | ORAL_TABLET | Freq: Every day | ORAL | 1 refills | Status: DC

## 2018-07-28 NOTE — Progress Notes (Signed)
Subjective:    Patient ID: Lori Kaiser, female    DOB: 1961-04-11, 57 y.o.   MRN: [id number]  HPI  Lori Kaiser is a 57 year old female who presents today to establish care and discuss the problems mentioned below. Will obtain old records.  1) IBS/GERD: Previously managed on ranitidine 150 mg for which she takes daily. She ran out of her ranitidine two days ago. She experiences symptoms of esophageal burning and pressure without medications. She is now using Tums. She was once managed on pantoprazole per GI, has not taken in years and is overall doing well on Zantac.   She does experience constipation type IBS now, once diarrhea type. She has irregular bowel movements that will occur 5-6 times daily to no bowel movement for 2 days.   2) Allergic Rhinitis: Currently managed on flonase propionate PRN. She has noticed ear fullness and discomfort to the right ear with radiation down to the right cervical chain of her neck. She denies fevers, cough, shortness of breath.   3) Major Depressive Disorder/GAD: Previously managed on fluoxetine 40 mg for which she stopped taking three years ago. She does believe that the fluoxetine helped with anxiety and depression symptoms.   She's experiencing symptoms of difficulty relaxing, difficulty sleeping, feels jittery, frequent worry. She denies feeling depressed. GAD 7 score of 20 today.   4) Migraines: Chronic. She was once seeing neurology and was managed on topiramate daily, flurbiprofen PRN. She does have migraines 2-3 times weekly on average now which are located to the right temporal, occipital, and behind her right eye. She is not taking anything for migraines or headaches. She did well on topiramate in the past.   Review of Systems  Constitutional: Negative for fever.  HENT: Negative for congestion.   Respiratory: Negative for cough and shortness of breath.   Cardiovascular: Negative for chest pain.  Gastrointestinal:       GERD    Allergic/Immunologic: Positive for environmental allergies.  Neurological: Positive for headaches.       Migraines  Psychiatric/Behavioral: Positive for sleep disturbance. The patient is nervous/anxious.     BP Readings from Last 3 Encounters:  07/28/18 122/76  12/12/14 138/78  11/23/14 134/87       Past Medical History:  Diagnosis Date  . allergies   . Allergy   . Anemia   . Anxiety   . Blood transfusion without reported diagnosis   . Chronic headaches   . Depression   . Eating disorder   . Fatigue   . GERD (gastroesophageal reflux disease)   . History of chicken pox   . Irritable bowel syndrome (IBS)   . Migraine   . Urine incontinence      Social History   Socioeconomic History  . Marital status: Married    Spouse name: Not on file  . Number of children: 2  . Years of education: Not on file  . Highest education level: Not on file  Occupational History  . 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 Financial resource strain: Not on file  . Food insecurity:    Worry: Not on file    Inability: Not on file  . Transportation needs:    Medical: Not on file    Non-medical: Not on file  Tobacco Use  . Smoking status: Former Smoker    Packs/day: 1.00    Years: 12.00    Pack years: 12.00  Types: Cigarettes  . Smokeless tobacco: Never Used  Substance and Sexual Activity  . Alcohol use: No  . Drug use: No  . Sexual activity: Never  Lifestyle  . Physical activity:    Days per week: Not on file    Minutes per session: Not on file  . Stress: Not on file  Relationships  . Social connections:    Talks on phone: Not on file    Gets together: Not on file    Attends religious service: Not on file    Active member of club or organization: Not on file    Attends meetings of clubs or organizations: Not on file    Relationship status: Not on file  . Intimate partner violence:    Fear of current or ex partner: Not on file     Emotionally abused: Not on file    Physically abused: Not on file    Forced sexual activity: Not on file  Other Topics Concern  . Not on file  Social History Narrative   Divorced, twins age 36   Diet: moderate, water   Exercise: walks sporadically    Past Surgical History:  Procedure Laterality Date  . CESAREAN SECTION  1996  . Dental Implant  2013  . 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 Heart disease Father 65  . Atrial fibrillation Father   . Heart disease Paternal Aunt   . Heart disease Paternal Grandmother   . Lung cancer Paternal Grandmother   . Stroke Paternal Grandmother   . Lung cancer Paternal Grandfather   . Lung cancer Maternal Grandfather   . Kidney cancer Maternal Grandmother     Allergies  Allergen Reactions  . Penicillins Rash    Current Outpatient Medications on File Prior to Visit  Medication Sig Dispense Refill  . fluticasone (FLONASE) 50 MCG/ACT nasal spray Place 1 spray into both nostrils daily.    Marland Kitchen nystatin-triamcinolone (MYCOLOG II) cream   1  . calcium carbonate (TUMS) 500 MG chewable tablet Chew by mouth.     No current facility-administered medications on file prior to visit.     BP 122/76   Pulse (!) 102   Temp 98.2 F (36.8 C) (Oral)   Ht 5' 6.25" (1.683 m)   Wt 271 lb 8 oz (123.2 kg)   LMP 01/24/2014   SpO2 96%   BMI 43.49 kg/m    Objective:   Physical Exam  Constitutional: She appears well-nourished.  HENT:  Mouth/Throat: Oropharynx is clear and moist.  Right TM with effusion and mild bulging.  Neck: Neck supple.  Cardiovascular: Normal rate and regular rhythm.  Respiratory: Effort normal and breath sounds normal.  Lymphadenopathy:    She has no cervical adenopathy.  Skin: Skin is warm and dry.  Psychiatric: She has a normal mood and affect.           Assessment & Plan:

## 2018-07-28 NOTE — Assessment & Plan Note (Signed)
Uncontrolled. GAD 7 score of 20 today. Given that she did well on fluoxetine in the past, will resume at 20 mg.   Patient is to take 1/2 tablet daily for 8 days, then advance to 1 full tablet thereafter. We discussed possible side effects of headache, GI upset, drowsiness, and SI/HI. If thoughts of SI/HI develop, we discussed to present to the emergency immediately. Patient verbalized understanding.   Follow up in 6 weeks for re-evaluation.

## 2018-07-28 NOTE — Assessment & Plan Note (Signed)
Denies symptoms of depression. 

## 2018-07-28 NOTE — Assessment & Plan Note (Signed)
Uncontrolled with migraines every 2-3 days.  Will restart Topamax at 50 mg HS given that she did well in the past. Consider increasing dose gradually as needed.   Follow up in 6 weeks.

## 2018-07-28 NOTE — Assessment & Plan Note (Signed)
Managed well on Zantac but can no longer find in stores due to recent recall. Switch to Pepcid. She will update.

## 2018-07-28 NOTE — Assessment & Plan Note (Signed)
Chronic, using flonase. Suspect ear and right neck symptoms to be secondary to allergies. Start Claritin daily. She will update.

## 2018-07-28 NOTE — Addendum Note (Signed)
Addended by: Jacqualin Combes on: 07/28/2018 12:39 PM   Modules accepted: Orders

## 2018-07-28 NOTE — Assessment & Plan Note (Signed)
Constipation type mostly. Continue to monitor.  Didn't get to discuss much today given other medical issues.

## 2018-07-28 NOTE — Patient Instructions (Addendum)
We've switched you from ranitidine (Zantac) to famotidine (Pepcid) for heartburn. You may take 1 tablet once or twice daily as needed.  Start topiramate (Topamax) 50 mg for migraine prevention. Take 1 tablet at bedtime.  Start fluoxetine 20 mg tablets for anxiety. Start by taking 1/2 tablet daily for 6 days, then increase to 1 full tablet thereafter.   Resume loratadine (Claritin) for allergies. Continue Flonase.  Schedule a follow up visit in 6 weeks for re-evaluation of migraines and anxiety.  It was a pleasure meeting you!

## 2018-07-29 DIAGNOSIS — N94810 Vulvar vestibulitis: Secondary | ICD-10-CM | POA: Diagnosis not present

## 2018-07-29 DIAGNOSIS — G43009 Migraine without aura, not intractable, without status migrainosus: Secondary | ICD-10-CM | POA: Diagnosis not present

## 2018-07-29 DIAGNOSIS — Z23 Encounter for immunization: Secondary | ICD-10-CM | POA: Diagnosis not present

## 2018-07-29 DIAGNOSIS — N905 Atrophy of vulva: Secondary | ICD-10-CM | POA: Diagnosis not present

## 2018-07-29 DIAGNOSIS — N941 Unspecified dyspareunia: Secondary | ICD-10-CM | POA: Diagnosis not present

## 2018-07-29 DIAGNOSIS — N952 Postmenopausal atrophic vaginitis: Secondary | ICD-10-CM | POA: Diagnosis not present

## 2018-07-29 DIAGNOSIS — N942 Vaginismus: Secondary | ICD-10-CM | POA: Diagnosis not present

## 2018-08-01 DIAGNOSIS — N941 Unspecified dyspareunia: Secondary | ICD-10-CM | POA: Diagnosis not present

## 2018-08-01 DIAGNOSIS — N952 Postmenopausal atrophic vaginitis: Secondary | ICD-10-CM | POA: Diagnosis not present

## 2018-08-01 DIAGNOSIS — N942 Vaginismus: Secondary | ICD-10-CM | POA: Diagnosis not present

## 2018-08-01 DIAGNOSIS — N94810 Vulvar vestibulitis: Secondary | ICD-10-CM | POA: Diagnosis not present

## 2018-08-01 DIAGNOSIS — N905 Atrophy of vulva: Secondary | ICD-10-CM | POA: Diagnosis not present

## 2018-08-22 ENCOUNTER — Other Ambulatory Visit: Payer: Self-pay | Admitting: Primary Care

## 2018-08-22 DIAGNOSIS — F411 Generalized anxiety disorder: Secondary | ICD-10-CM

## 2018-09-05 ENCOUNTER — Ambulatory Visit: Payer: Federal, State, Local not specified - PPO | Admitting: Primary Care

## 2018-09-18 ENCOUNTER — Other Ambulatory Visit: Payer: Self-pay | Admitting: Primary Care

## 2018-09-18 DIAGNOSIS — G43009 Migraine without aura, not intractable, without status migrainosus: Secondary | ICD-10-CM

## 2018-09-22 ENCOUNTER — Ambulatory Visit: Payer: Federal, State, Local not specified - PPO | Admitting: Primary Care

## 2018-10-27 ENCOUNTER — Ambulatory Visit: Payer: Federal, State, Local not specified - PPO | Admitting: Primary Care

## 2018-10-27 DIAGNOSIS — K080 Exfoliation of teeth due to systemic causes: Secondary | ICD-10-CM | POA: Diagnosis not present

## 2019-01-09 ENCOUNTER — Other Ambulatory Visit: Payer: Self-pay | Admitting: Primary Care

## 2019-01-09 DIAGNOSIS — G43009 Migraine without aura, not intractable, without status migrainosus: Secondary | ICD-10-CM

## 2019-01-20 ENCOUNTER — Observation Stay
Admission: EM | Admit: 2019-01-20 | Discharge: 2019-01-22 | Disposition: A | Discharge status: Home / Self Care | Payer: Federal, State, Local not specified - PPO | Attending: Surgery | Admitting: Surgery

## 2019-01-20 ENCOUNTER — Other Ambulatory Visit: Payer: Self-pay

## 2019-01-20 ENCOUNTER — Emergency Department: Payer: Federal, State, Local not specified - PPO

## 2019-01-20 DIAGNOSIS — Z7951 Long term (current) use of inhaled steroids: Secondary | ICD-10-CM | POA: Diagnosis not present

## 2019-01-20 DIAGNOSIS — Z87891 Personal history of nicotine dependence: Secondary | ICD-10-CM | POA: Diagnosis not present

## 2019-01-20 DIAGNOSIS — F419 Anxiety disorder, unspecified: Secondary | ICD-10-CM | POA: Diagnosis not present

## 2019-01-20 DIAGNOSIS — F329 Major depressive disorder, single episode, unspecified: Secondary | ICD-10-CM | POA: Insufficient documentation

## 2019-01-20 DIAGNOSIS — K810 Acute cholecystitis: Principal | ICD-10-CM | POA: Insufficient documentation

## 2019-01-20 DIAGNOSIS — Z880 Allergy status to penicillin: Secondary | ICD-10-CM | POA: Diagnosis not present

## 2019-01-20 DIAGNOSIS — K589 Irritable bowel syndrome without diarrhea: Secondary | ICD-10-CM | POA: Insufficient documentation

## 2019-01-20 DIAGNOSIS — K819 Cholecystitis, unspecified: Secondary | ICD-10-CM | POA: Diagnosis not present

## 2019-01-20 DIAGNOSIS — Z881 Allergy status to other antibiotic agents status: Secondary | ICD-10-CM | POA: Diagnosis not present

## 2019-01-20 DIAGNOSIS — R109 Unspecified abdominal pain: Secondary | ICD-10-CM

## 2019-01-20 DIAGNOSIS — K219 Gastro-esophageal reflux disease without esophagitis: Secondary | ICD-10-CM | POA: Insufficient documentation

## 2019-01-20 DIAGNOSIS — G43909 Migraine, unspecified, not intractable, without status migrainosus: Secondary | ICD-10-CM | POA: Diagnosis not present

## 2019-01-20 DIAGNOSIS — R1011 Right upper quadrant pain: Secondary | ICD-10-CM | POA: Diagnosis not present

## 2019-01-20 DIAGNOSIS — Z6841 Body Mass Index (BMI) 40.0 and over, adult: Secondary | ICD-10-CM | POA: Insufficient documentation

## 2019-01-20 DIAGNOSIS — K82A1 Gangrene of gallbladder in cholecystitis: Secondary | ICD-10-CM | POA: Insufficient documentation

## 2019-01-20 DIAGNOSIS — Z79899 Other long term (current) drug therapy: Secondary | ICD-10-CM | POA: Insufficient documentation

## 2019-01-20 LAB — COMPREHENSIVE METABOLIC PANEL
ALT: 21 U/L (ref 0–44)
AST: 25 U/L (ref 15–41)
Albumin: 3.8 g/dL (ref 3.5–5.0)
Alkaline Phosphatase: 109 U/L (ref 38–126)
Anion gap: 12 (ref 5–15)
BUN: 9 mg/dL (ref 6–20)
CO2: 23 mmol/L (ref 22–32)
Calcium: 8.9 mg/dL (ref 8.9–10.3)
Chloride: 102 mmol/L (ref 98–111)
Creatinine, Ser: 0.78 mg/dL (ref 0.44–1.00)
GFR calc Af Amer: >60 mL/min (ref >60)
GFR calc non Af Amer: >60 mL/min (ref >60)
Glucose, Bld: 152 mg/dL — ABNORMAL HIGH (ref 70–99)
Potassium: 3.6 mmol/L (ref 3.5–5.1)
Sodium: 137 mmol/L (ref 135–145)
Total Bilirubin: 0.9 mg/dL (ref 0.3–1.2)
Total Protein: 7.7 g/dL (ref 6.5–8.1)

## 2019-01-20 LAB — URINALYSIS, COMPLETE (UACMP) WITH MICROSCOPIC
Bacteria, UA: NONE SEEN
Bilirubin Urine: NEGATIVE
Glucose, UA: NEGATIVE mg/dL
Hgb urine dipstick: NEGATIVE
Ketones, ur: NEGATIVE mg/dL
Leukocytes,Ua: NEGATIVE
Nitrite: NEGATIVE
Protein, ur: NEGATIVE mg/dL
Specific Gravity, Urine: 1.02 (ref 1.005–1.030)
pH: 6 (ref 5.0–8.0)

## 2019-01-20 LAB — CBC
HCT: 41.7 % (ref 36.0–46.0)
Hemoglobin: 13.8 g/dL (ref 12.0–15.0)
MCH: 29.6 pg (ref 26.0–34.0)
MCHC: 33.1 g/dL (ref 30.0–36.0)
MCV: 89.3 fL (ref 80.0–100.0)
Platelets: 285 10*3/uL (ref 150–400)
RBC: 4.67 MIL/uL (ref 3.87–5.11)
RDW: 12.8 % (ref 11.5–15.5)
WBC: 11.7 10*3/uL — ABNORMAL HIGH (ref 4.0–10.5)
nRBC: 0 % (ref 0.0–0.2)

## 2019-01-20 LAB — LIPASE, BLOOD: Lipase: 24 U/L (ref 11–51)

## 2019-01-20 LAB — HCG, QUANTITATIVE, PREGNANCY: hCG, Beta Chain, Quant, S: 3 m[IU]/mL (ref <5)

## 2019-01-20 MED ORDER — OXYCODONE HCL 5 MG PO TABS
5.0000 mg | ORAL_TABLET | ORAL | Status: DC | PRN
  Administered 2019-01-21 – 2019-01-22 (×4): 5 mg via ORAL
  Filled 2019-01-20 (×4): qty 1

## 2019-01-20 MED ORDER — METRONIDAZOLE IN NACL 5-0.79 MG/ML-% IV SOLN
500.0000 mg | Freq: Three times a day (TID) | INTRAVENOUS | Status: DC
  Administered 2019-01-20 – 2019-01-22 (×5): 500 mg via INTRAVENOUS
  Filled 2019-01-20 (×8): qty 100

## 2019-01-20 MED ORDER — SODIUM CHLORIDE 0.9 % IV BOLUS
500.0000 mL | Freq: Once | INTRAVENOUS | Status: AC
  Administered 2019-01-20: 500 mL via INTRAVENOUS

## 2019-01-20 MED ORDER — SODIUM CHLORIDE 0.9 % IV SOLN
INTRAVENOUS | Status: DC
  Administered 2019-01-20 – 2019-01-22 (×5): via INTRAVENOUS

## 2019-01-20 MED ORDER — MORPHINE SULFATE (PF) 4 MG/ML IV SOLN
4.0000 mg | Freq: Once | INTRAVENOUS | Status: AC
  Administered 2019-01-20: 4 mg via INTRAVENOUS
  Filled 2019-01-20: qty 1

## 2019-01-20 MED ORDER — ONDANSETRON HCL 4 MG/2ML IJ SOLN
4.0000 mg | Freq: Once | INTRAMUSCULAR | Status: AC
  Administered 2019-01-20: 4 mg via INTRAVENOUS
  Filled 2019-01-20: qty 2

## 2019-01-20 MED ORDER — PANTOPRAZOLE SODIUM 40 MG IV SOLR
40.0000 mg | Freq: Every day | INTRAVENOUS | Status: DC
  Administered 2019-01-20 – 2019-01-21 (×2): 40 mg via INTRAVENOUS
  Filled 2019-01-20 (×2): qty 40

## 2019-01-20 MED ORDER — PROCHLORPERAZINE MALEATE 10 MG PO TABS
10.0000 mg | ORAL_TABLET | Freq: Four times a day (QID) | ORAL | Status: DC | PRN
  Filled 2019-01-20: qty 1

## 2019-01-20 MED ORDER — ONDANSETRON HCL 4 MG/2ML IJ SOLN
4.0000 mg | Freq: Four times a day (QID) | INTRAMUSCULAR | Status: DC | PRN
  Administered 2019-01-21: 4 mg via INTRAVENOUS
  Filled 2019-01-20: qty 2

## 2019-01-20 MED ORDER — CIPROFLOXACIN IN D5W 400 MG/200ML IV SOLN
400.0000 mg | Freq: Two times a day (BID) | INTRAVENOUS | Status: DC
  Administered 2019-01-20 – 2019-01-22 (×4): 400 mg via INTRAVENOUS
  Filled 2019-01-20 (×6): qty 200

## 2019-01-20 MED ORDER — ALPRAZOLAM 0.5 MG PO TABS
0.5000 mg | ORAL_TABLET | Freq: Once | ORAL | Status: AC
  Administered 2019-01-20: 0.5 mg via ORAL
  Filled 2019-01-20: qty 1

## 2019-01-20 MED ORDER — ONDANSETRON 4 MG PO TBDP: 4.0000 mg | ORAL_TABLET | Freq: Four times a day (QID) | ORAL | Status: DC | PRN

## 2019-01-20 MED ORDER — HYDRALAZINE HCL 20 MG/ML IJ SOLN: 10.0000 mg | INTRAMUSCULAR | Status: DC | PRN

## 2019-01-20 MED ORDER — ACETAMINOPHEN 500 MG PO TABS
1000.0000 mg | ORAL_TABLET | Freq: Four times a day (QID) | ORAL | Status: DC
  Administered 2019-01-20 – 2019-01-22 (×6): 1000 mg via ORAL
  Filled 2019-01-20 (×6): qty 2

## 2019-01-20 MED ORDER — MORPHINE SULFATE (PF) 4 MG/ML IV SOLN
4.0000 mg | INTRAVENOUS | Status: DC | PRN
  Administered 2019-01-21: 4 mg via INTRAVENOUS
  Filled 2019-01-20: qty 1

## 2019-01-20 MED ORDER — KETOROLAC TROMETHAMINE 30 MG/ML IJ SOLN
30.0000 mg | Freq: Four times a day (QID) | INTRAMUSCULAR | Status: DC | PRN
  Administered 2019-01-20 – 2019-01-21 (×3): 30 mg via INTRAVENOUS
  Filled 2019-01-20 (×3): qty 1

## 2019-01-20 MED ORDER — PROCHLORPERAZINE EDISYLATE 10 MG/2ML IJ SOLN
5.0000 mg | Freq: Four times a day (QID) | INTRAMUSCULAR | Status: DC | PRN
  Filled 2019-01-20: qty 2

## 2019-01-20 MED ORDER — HEPARIN SODIUM (PORCINE) 5000 UNIT/ML IJ SOLN
5000.0000 [IU] | Freq: Three times a day (TID) | INTRAMUSCULAR | Status: DC
  Administered 2019-01-21 – 2019-01-22 (×3): 5000 [IU] via SUBCUTANEOUS
  Filled 2019-01-20 (×3): qty 1

## 2019-01-20 NOTE — ED Provider Notes (Signed)
[facility] Emergency Department Provider Note ____________________________________________   First MD Initiated Contact with Patient 01/20/19 1815     (approximate)  I have reviewed the triage vital signs and the nursing notes.  HISTORY  Chief Complaint Abdominal Pain  HPI Lori Kaiser is a 58 y.o. female here for evaluation of abdominal pain  Patient reports that she began experiencing abdominal pain last night after eating a meal.  It persisted slightly throughout the night, a little bit of nausea no vomiting.  No diarrhea.  When she tried to eat something this morning chicken soup with low appetite and she getting started experiencing the pain is been persistent it is located mostly in her upper right abdomen and radiates a little bit towards her mid back.  No shortness of breath or chest pain.  Denies left side abdominal pain.  No pain or burning with urination  She reports a family history of gallbladder issues.  Denies history of have alcohol abuse.  She is had a previous C-section but no other intra-abdominal surgeries.  Pain is currently moderate located mostly in the right upper abdomen feels like a crampy discomfort   Past Medical History:  Diagnosis Date   allergies    Allergy    Anemia    Anxiety    Blood transfusion without reported diagnosis    Chronic headaches    Depression    Eating disorder    Fatigue    GERD (gastroesophageal reflux disease)    History of chicken pox    Irritable bowel syndrome (IBS)    Migraine    Urine incontinence     Patient Active Problem List   Diagnosis Date Noted   Generalized anxiety disorder 03/01/2014   Panic attacks 03/01/2014   GERD (gastroesophageal reflux disease) 03/01/2014   Allergic rhinitis 03/01/2014   Fibroids 03/01/2014   Stress incontinence 03/01/2014   Family history of early CAD 03/01/2014   Common migraine without aura 01/17/2013   IBS (irritable bowel  syndrome) 01/17/2013   Major depressive disorder, recurrent ([place]) 01/17/2013    Past Surgical History:  Procedure Laterality Date   Napavine   Dental Implant  2013   DILATION AND CURETTAGE OF UTERUS     x2 [place]    Prior to Admission medications   Medication Sig Start Date End Date Taking? Authorizing Provider  famotidine (PEPCID) 20 MG tablet Take 1 tablet by mouth once to twice daily for heartburn. Patient not taking: Reported on 01/20/2019 07/28/18   Pleas Koch, NP  FLUoxetine (PROZAC) 20 MG tablet Take 1 tablet (20 mg total) by mouth daily. For anxiety. Patient not taking: Reported on 01/20/2019 07/28/18   Pleas Koch, NP  fluticasone [facility]) 50 MCG/ACT nasal spray Place 1 spray into both nostrils daily as needed for allergies.     [provider]  topiramate (TOPAMAX) 50 MG tablet TAKE 1 TABLET (50 MG TOTAL) BY MOUTH AT BEDTIME. FOR MIGRAINE PREVENTION. 01/09/19   Pleas Koch, NP    Allergies Zithromax [azithromycin] and Penicillins  Family History  Problem Relation Age of Onset   Heart disease Father 57   Atrial fibrillation Father    Heart disease Paternal Aunt    Heart disease Paternal Grandmother    Lung cancer Paternal Grandmother    Stroke Paternal Grandmother    Lung cancer Paternal Grandfather    Lung cancer Maternal Grandfather    Kidney cancer Maternal Grandmother  Social History Social History   Tobacco Use   Smoking status: Former Smoker    Packs/day: 1.00    Years: 12.00    Pack years: 12.00    Types: Cigarettes   Smokeless tobacco: Never Used  Substance Use Topics   Alcohol use: No   Drug use: No    Review of Systems Constitutional: No fever/chills Eyes: No visual changes. ENT: No sore throat. Cardiovascular: Denies chest pain. Respiratory: Denies shortness of breath. Gastrointestinal: See HPI Genitourinary: Negative for dysuria.  Denies  pregnancy. Musculoskeletal: Negative for back pain. Skin: Negative for rash. Neurological: Negative for headaches, areas of focal weakness or numbness.    ____________________________________________   PHYSICAL EXAM:  VITAL SIGNS: ED Triage Vitals  Enc Vitals Group     BP 01/20/19 1758 (!) 153/80     Pulse Rate 01/20/19 1758 80     Resp 01/20/19 1758 18     Temp 01/20/19 1758 98.1 F (36.7 C)     Temp Source 01/20/19 1758 Oral     SpO2 01/20/19 1758 100 %     Weight 01/20/19 1759 270 lb (122.5 kg)     Height 01/20/19 1759 5\' 7"  (1.702 m)     Head Circumference --      Peak Flow --      Pain Score 01/20/19 1759 8     Pain Loc --      Pain Edu? --      Excl. in [place]? --     Constitutional: Alert and oriented. Well appearing and in no acute distress. Eyes: Conjunctivae are normal. Head: Atraumatic. Nose: No congestion/rhinnorhea. Mouth/Throat: Mucous membranes are moist. Neck: No stridor.  Cardiovascular: Normal rate, regular rhythm. Grossly normal heart sounds.  Good peripheral circulation. Respiratory: Normal respiratory effort.  No retractions. Lungs CTAB. Gastrointestinal: Mild tenderness primarily to deep palpation in the right flank right upper quadrant.  No rebound or guarding.  No pain at McBurney's point.  Equivocal Murphy Musculoskeletal: No lower extremity tenderness nor edema. Neurologic:  Normal speech and language. No gross focal neurologic deficits are appreciated.  Skin:  Skin is warm, dry and intact. No rash noted. Psychiatric: Mood and affect are normal. Speech and behavior are normal.  ____________________________________________   LABS (all labs ordered are listed, but only abnormal results are displayed)  Labs Reviewed  COMPREHENSIVE METABOLIC PANEL - Abnormal; Notable for the following components:      Result Value   Glucose, Bld 152 (*)    All other components within normal limits  CBC - Abnormal; Notable for the following components:   WBC  11.7 (*)    All other components within normal limits  URINALYSIS, COMPLETE (UACMP) WITH MICROSCOPIC - Abnormal; Notable for the following components:   Color, Urine YELLOW (*)    APPearance CLEAR (*)    All other components within normal limits  LIPASE, BLOOD   ____________________________________________  EKG   ____________________________________________  RADIOLOGY  US Abdomen Limited Ruq  Result Date: 01/20/2019 CLINICAL DATA:  Acute right upper quadrant abdominal pain. EXAM: ULTRASOUND ABDOMEN LIMITED RIGHT UPPER QUADRANT COMPARISON:  None. FINDINGS: Gallbladder: Cholelithiasis is noted with mild gallbladder wall thickening measuring 4 mm. No pericholecystic fluid is noted. Positive sonographic Murphy's sign is noted. Common bile duct: Diameter: 5 mm which is within normal limits. Liver: No focal lesion identified. Increased echogenicity of hepatic parenchyma is noted suggesting hepatic steatosis. Portal vein is patent on color Doppler imaging with normal direction of blood flow towards the liver. IMPRESSION:  Cholelithiasis with mild gallbladder wall thickening and positive sonographic Murphy's sign concerning for possible cholecystitis. Clinical correlation is recommended. Probable hepatic steatosis. Electronically Signed   By: Marijo Conception, M.D.   On: 01/20/2019 19:17    Results reviewed by me, concerning for cholecystitis ____________________________________________   PROCEDURES  Procedure(s) performed: None  Procedures  Critical Care performed: No  ____________________________________________   INITIAL IMPRESSION / ASSESSMENT AND PLAN / ED COURSE  Pertinent labs & imaging results that were available during my care of the patient were reviewed by me and considered in my medical decision making (see chart for details).   Differential diagnosis includes but is not limited to, abdominal perforation, aortic dissection, cholecystitis, appendicitis, diverticulitis,  colitis, esophagitis/gastritis, kidney stone, pyelonephritis, urinary tract infection, aortic aneurysm. All are considered in decision and treatment plan. Based upon the patient's presentation and risk factors, given the clinical history, will proceed with ultrasound first to evaluate for right upper quadrant pathology as we await lab work and provide antiemetic.  If right upper quadrant negative for pathology, discussed with the patient and we will proceed with CT scan to further evaluate for cause.    LOVETTA CONDIE was evaluated in Emergency Department on 01/20/2019 for the symptoms described in the history of present illness. She was evaluated in the context of the global COVID-19 pandemic, which necessitated consideration that the patient might be at risk for infection with the SARS-CoV-2 virus that causes COVID-19. Institutional protocols and algorithms that pertain to the evaluation of patients at risk for COVID-19 are in a state of rapid change based on information released by regulatory bodies including the CDC and federal and state organizations. These policies and algorithms were followed during the patient's care in the ED.  Clinical symptomatology does not seem to fit a picture of coronavirus.  ----------------------------------------- 7:32 PM on 01/20/2019 -----------------------------------------  Discussed and reviewed results and imaging with Dr. Dahlia Byes of general surgery.  He is admitting the patient to his service for concerns of cholecystitis.  Patient is comfortable with this plan agreeable with admission.  ____________________________________________   FINAL CLINICAL IMPRESSION(S) / ED DIAGNOSES  Final diagnoses:  Abdominal pain  Cholecystitis        Note:  This document was prepared using Dragon voice recognition software and may include unintentional dictation errors       Delman Kitten, MD 01/20/19 1932

## 2019-01-20 NOTE — ED Triage Notes (Signed)
Right sided abdominal pain that radiates to left side. Began last night. C/o bilateral flank pain as well. Pt alert and oriented X4, active, cooperative, pt in NAD. RR even and unlabored, color WNL.

## 2019-01-20 NOTE — ED Notes (Addendum)
ED TO INPATIENT HANDOFF REPORT  ED Nurse Name and Phone #: Karena Addison  [phone]  S Name/Age/Gender Lori Kaiser 58 y.o. female Room/Bed: ED05A/ED05A  Code Status   Code Status: Full Code  Home/SNF/Other Home Patient oriented to: self, place, time and situation Is this baseline? Yes   Triage Complete: Triage complete  Chief Complaint Abdominal Pain  Triage Note Right sided abdominal pain that radiates to left side. Began last night. C/o bilateral flank pain as well. Pt alert and oriented X4, active, cooperative, pt in NAD. RR even and unlabored, color WNL.     Allergies Allergies  Allergen Reactions  . Zithromax [Azithromycin]   . Penicillins Rash    Did it involve swelling of the face/tongue/throat, SOB, or low BP? No Did it involve sudden or severe rash/hives, skin peeling, or any reaction on the inside of your mouth or nose? YES Did you need to seek medical attention at a hospital or doctor's office? No When did it last happen? If all above answers are "NO", may proceed with cephalosporin use.    Level of Care/Admitting Diagnosis ED Disposition    ED Disposition Condition Comment   Admit  Hospital Area: [place] [[id number]]  Level of Care: Med-Surg [16]  Diagnosis: Cholecystitis [010272]  Admitting Physician: Jules Husbands [[id number]]  Attending Physician: Jules Husbands [[id number]]  PT Class (Do Not Modify): Observation [104]  PT Acc Code (Do Not Modify): Observation [10022]       B Medical/Surgery History Past Medical History:  Diagnosis Date  . allergies   . Allergy   . Anemia   . Anxiety   . Blood transfusion without reported diagnosis   . Chronic headaches   . Depression   . Eating disorder   . Fatigue   . GERD (gastroesophageal reflux disease)   . History of chicken pox   . Irritable bowel syndrome (IBS)   . Migraine   . Urine incontinence    Past Surgical History:  Procedure Laterality Date  . CESAREAN SECTION  1996  .  Dental Implant  2013  . DILATION AND CURETTAGE OF UTERUS     x2 [place]     A IV Location/Drains/Wounds Patient Lines/Drains/Airways Status   Active Line/Drains/Airways    Name:   Placement date:   Placement time:   Site:   Days:   Peripheral IV 01/20/19 Left Antecubital   01/20/19    1820    Antecubital   less than 1          Intake/Output Last 24 hours No intake or output data in the 24 hours ending 01/20/19 2005  Labs/Imaging Results for orders placed or performed during the hospital encounter of 01/20/19 (from the past 48 hour(s))  Lipase, blood     Status: None   Collection Time: 01/20/19  6:21 PM  Result Value Ref Range   Lipase 24 11 - 51 U/L    Comment: Performed at [facility], [address] [place], [place] [address]  Comprehensive metabolic panel     Status: Abnormal   Collection Time: 01/20/19  6:21 PM  Result Value Ref Range   Sodium 137 135 - 145 mmol/L   Potassium 3.6 3.5 - 5.1 mmol/L   Chloride 102 98 - 111 mmol/L   CO2 23 22 - 32 mmol/L   Glucose, Bld 152 (H) 70 - 99 mg/dL   BUN 9 6 - 20 mg/dL   Creatinine, Ser 0.78 0.44 -  1.00 mg/dL   Calcium 8.9 8.9 - 10.3 mg/dL   Total Protein 7.7 6.5 - 8.1 g/dL   Albumin 3.8 3.5 - 5.0 g/dL   AST 25 15 - 41 U/L   ALT 21 0 - 44 U/L   Alkaline Phosphatase 109 38 - 126 U/L   Total Bilirubin 0.9 0.3 - 1.2 mg/dL   GFR calc non Af Amer >60 >60 mL/min   GFR calc Af Amer >60 >60 mL/min   Anion gap 12 5 - 15    Comment: Performed at [facility], [address] [place], [place] [address]  CBC     Status: Abnormal   Collection Time: 01/20/19  6:21 PM  Result Value Ref Range   WBC 11.7 (H) 4.0 - 10.5 K/uL   RBC 4.67 3.87 - 5.11 MIL/uL   Hemoglobin 13.8 12.0 - 15.0 g/dL   HCT 41.7 36.0 - 46.0 %   MCV 89.3 80.0 - 100.0 fL   MCH 29.6 26.0 - 34.0 pg   MCHC 33.1 30.0 - 36.0 g/dL   RDW 12.8 11.5 - 15.5 %   Platelets 285 150 - 400 K/uL   nRBC 0.0 0.0 - 0.2 %    Comment: Performed  at [facility], [address] [place],  [address]  Urinalysis, Complete w Microscopic     Status: Abnormal   Collection Time: 01/20/19  6:21 PM  Result Value Ref Range   Color, Urine YELLOW (A) YELLOW   APPearance CLEAR (A) CLEAR   Specific Gravity, Urine 1.020 1.005 - 1.030   pH 6.0 5.0 - 8.0   Glucose, UA NEGATIVE NEGATIVE mg/dL   Hgb urine dipstick NEGATIVE NEGATIVE   Bilirubin Urine NEGATIVE NEGATIVE   Ketones, ur NEGATIVE NEGATIVE mg/dL   Protein, ur NEGATIVE NEGATIVE mg/dL   Nitrite NEGATIVE NEGATIVE   Leukocytes,Ua NEGATIVE NEGATIVE   RBC / HPF 0-5 0 - 5 RBC/hpf   WBC, UA 0-5 0 - 5 WBC/hpf   Bacteria, UA NONE SEEN NONE SEEN   Squamous Epithelial / LPF 0-5 0 - 5   Mucus PRESENT     Comment: Performed at [facility], [address], [place],  [address]   US Abdomen Limited Ruq  Result Date: 01/20/2019 CLINICAL DATA:  Acute right upper quadrant abdominal pain. EXAM: ULTRASOUND ABDOMEN LIMITED RIGHT UPPER QUADRANT COMPARISON:  None. FINDINGS: Gallbladder: Cholelithiasis is noted with mild gallbladder wall thickening measuring 4 mm. No pericholecystic fluid is noted. Positive sonographic Murphy's sign is noted. Common bile duct: Diameter: 5 mm which is within normal limits. Liver: No focal lesion identified. Increased echogenicity of hepatic parenchyma is noted suggesting hepatic steatosis. Portal vein is patent on color Doppler imaging with normal direction of blood flow towards the liver. IMPRESSION: Cholelithiasis with mild gallbladder wall thickening and positive sonographic Murphy's sign concerning for possible cholecystitis. Clinical correlation is recommended. Probable hepatic steatosis. Electronically Signed   By: Marijo Conception, M.D.   On: 01/20/2019 19:17    Pending Labs Unresulted Labs (From admission, onward)    Start     Ordered   01/21/19 0500  HIV antibody (Routine Testing)  Tomorrow morning,   STAT     01/20/19 1939   01/20/19  1956  hCG, quantitative, pregnancy  Add-on,   AD     01/20/19 1955          Vitals/Pain Today's Vitals   01/20/19 1808 01/20/19 1809 01/20/19 1830 01/20/19 1931  BP: (!) 157/78  Marland Kitchen)  148/67 (!) 118/56  Pulse:  66 65 (!) 54  Resp: (!) 21 15 14 13   Temp:      TempSrc:      SpO2:  94% 95% 95%  Weight:      Height:      PainSc:        Isolation Precautions No active isolations  Medications Medications  heparin injection 5,000 Units (has no administration in time range)  0.9 %  sodium chloride infusion (has no administration in time range)  ciprofloxacin (CIPRO) IVPB 400 mg (has no administration in time range)  acetaminophen (TYLENOL) tablet 1,000 mg (has no administration in time range)  oxyCODONE (Oxy IR/ROXICODONE) immediate release tablet 5-10 mg (has no administration in time range)  ketorolac (TORADOL) 30 MG/ML injection 30 mg (30 mg Intravenous Given 01/20/19 2004)  morphine 4 MG/ML injection 4 mg (has no administration in time range)  ondansetron (ZOFRAN-ODT) disintegrating tablet 4 mg (has no administration in time range)    Or  ondansetron (ZOFRAN) injection 4 mg (has no administration in time range)  prochlorperazine (COMPAZINE) tablet 10 mg (has no administration in time range)    Or  prochlorperazine (COMPAZINE) injection 5-10 mg (has no administration in time range)  pantoprazole (PROTONIX) injection 40 mg (has no administration in time range)  hydrALAZINE (APRESOLINE) injection 10 mg (has no administration in time range)  metroNIDAZOLE (FLAGYL) IVPB 500 mg (has no administration in time range)  ondansetron (ZOFRAN) injection 4 mg (4 mg Intravenous Given 01/20/19 1859)  morphine 4 MG/ML injection 4 mg (4 mg Intravenous Given 01/20/19 1859)  sodium chloride 0.9 % bolus 500 mL (0 mLs Intravenous Stopped 01/20/19 1953)  morphine 4 MG/ML injection 4 mg (4 mg Intravenous Given 01/20/19 1928)  ALPRAZolam (XANAX) tablet 0.5 mg (0.5 mg Oral Given 01/20/19 2004)     Mobility walks Low fall risk   Focused Assessments    R Recommendations: See Admitting Provider Note  Report given to: Arrie Aran, RN

## 2019-01-21 ENCOUNTER — Encounter: Payer: Self-pay | Admitting: Anesthesiology

## 2019-01-21 ENCOUNTER — Encounter
Admission: EM | Disposition: A | Discharge status: Home / Self Care | Payer: Self-pay | Source: Home / Self Care | Attending: Emergency Medicine

## 2019-01-21 ENCOUNTER — Observation Stay: Payer: Federal, State, Local not specified - PPO | Admitting: Anesthesiology

## 2019-01-21 DIAGNOSIS — F419 Anxiety disorder, unspecified: Secondary | ICD-10-CM | POA: Diagnosis not present

## 2019-01-21 DIAGNOSIS — K819 Cholecystitis, unspecified: Secondary | ICD-10-CM

## 2019-01-21 DIAGNOSIS — K810 Acute cholecystitis: Secondary | ICD-10-CM | POA: Diagnosis not present

## 2019-01-21 DIAGNOSIS — F329 Major depressive disorder, single episode, unspecified: Secondary | ICD-10-CM | POA: Diagnosis not present

## 2019-01-21 HISTORY — PX: CHOLECYSTECTOMY: SHX55

## 2019-01-21 SURGERY — LAPAROSCOPIC CHOLECYSTECTOMY
Anesthesia: General | Site: Abdomen

## 2019-01-21 MED ORDER — ONDANSETRON HCL 4 MG/2ML IJ SOLN
INTRAMUSCULAR | Status: DC | PRN
  Administered 2019-01-21: 4 mg via INTRAVENOUS

## 2019-01-21 MED ORDER — ONDANSETRON HCL 4 MG/2ML IJ SOLN
INTRAMUSCULAR | Status: AC
  Filled 2019-01-21: qty 2

## 2019-01-21 MED ORDER — PROPOFOL 10 MG/ML IV BOLUS
INTRAVENOUS | Status: AC
  Filled 2019-01-21: qty 20

## 2019-01-21 MED ORDER — SUGAMMADEX SODIUM 500 MG/5ML IV SOLN
INTRAVENOUS | Status: AC
  Filled 2019-01-21: qty 5

## 2019-01-21 MED ORDER — MENTHOL 3 MG MT LOZG
1.0000 | LOZENGE | OROMUCOSAL | Status: DC | PRN
  Filled 2019-01-21: qty 9

## 2019-01-21 MED ORDER — EPHEDRINE SULFATE 50 MG/ML IJ SOLN
INTRAMUSCULAR | Status: AC
  Filled 2019-01-21: qty 1

## 2019-01-21 MED ORDER — MIDAZOLAM HCL 2 MG/2ML IJ SOLN
INTRAMUSCULAR | Status: AC
  Filled 2019-01-21: qty 2

## 2019-01-21 MED ORDER — LACTATED RINGERS IV SOLN
INTRAVENOUS | Status: DC | PRN
  Administered 2019-01-21: 09:00:00 via INTRAVENOUS

## 2019-01-21 MED ORDER — MIDAZOLAM HCL 2 MG/2ML IJ SOLN
INTRAMUSCULAR | Status: DC | PRN
  Administered 2019-01-21: 2 mg via INTRAVENOUS

## 2019-01-21 MED ORDER — FENTANYL CITRATE (PF) 100 MCG/2ML IJ SOLN
INTRAMUSCULAR | Status: AC
  Filled 2019-01-21: qty 2

## 2019-01-21 MED ORDER — LIDOCAINE HCL (CARDIAC) PF 100 MG/5ML IV SOSY
PREFILLED_SYRINGE | INTRAVENOUS | Status: DC | PRN
  Administered 2019-01-21: 60 mg via INTRAVENOUS

## 2019-01-21 MED ORDER — ROCURONIUM BROMIDE 100 MG/10ML IV SOLN
INTRAVENOUS | Status: DC | PRN
  Administered 2019-01-21: 45 mg via INTRAVENOUS
  Administered 2019-01-21: 5 mg via INTRAVENOUS

## 2019-01-21 MED ORDER — DEXAMETHASONE SODIUM PHOSPHATE 10 MG/ML IJ SOLN
INTRAMUSCULAR | Status: AC
  Filled 2019-01-21: qty 1

## 2019-01-21 MED ORDER — DEXAMETHASONE SODIUM PHOSPHATE 10 MG/ML IJ SOLN
INTRAMUSCULAR | Status: DC | PRN
  Administered 2019-01-21: 10 mg via INTRAVENOUS

## 2019-01-21 MED ORDER — BUPIVACAINE-EPINEPHRINE 0.25% -1:200000 IJ SOLN
INTRAMUSCULAR | Status: DC | PRN
  Administered 2019-01-21: 30 mL

## 2019-01-21 MED ORDER — EVICEL 5 ML EX KIT
PACK | CUTANEOUS | Status: DC | PRN
  Administered 2019-01-21: 5 mL

## 2019-01-21 MED ORDER — SUGAMMADEX SODIUM 500 MG/5ML IV SOLN
INTRAVENOUS | Status: DC | PRN
  Administered 2019-01-21: 250 mg via INTRAVENOUS

## 2019-01-21 MED ORDER — MIDAZOLAM HCL 2 MG/2ML IJ SOLN
1.0000 mg | Freq: Once | INTRAMUSCULAR | Status: AC
  Administered 2019-01-21: 1 mg via INTRAVENOUS

## 2019-01-21 MED ORDER — FENTANYL CITRATE (PF) 100 MCG/2ML IJ SOLN: 25.0000 ug | INTRAMUSCULAR | Status: DC | PRN

## 2019-01-21 MED ORDER — FENTANYL CITRATE (PF) 100 MCG/2ML IJ SOLN
INTRAMUSCULAR | Status: DC | PRN
  Administered 2019-01-21 (×2): 50 ug via INTRAVENOUS
  Administered 2019-01-21: 100 ug via INTRAVENOUS

## 2019-01-21 MED ORDER — EPHEDRINE SULFATE 50 MG/ML IJ SOLN
INTRAMUSCULAR | Status: DC | PRN
  Administered 2019-01-21: 15 mg via INTRAVENOUS

## 2019-01-21 MED ORDER — GABAPENTIN 600 MG PO TABS
600.0000 mg | ORAL_TABLET | Freq: Three times a day (TID) | ORAL | Status: DC
  Administered 2019-01-21 – 2019-01-22 (×3): 600 mg via ORAL
  Filled 2019-01-21 (×3): qty 1

## 2019-01-21 MED ORDER — SUCCINYLCHOLINE CHLORIDE 20 MG/ML IJ SOLN
INTRAMUSCULAR | Status: DC | PRN
  Administered 2019-01-21: 100 mg via INTRAVENOUS

## 2019-01-21 MED ORDER — LIDOCAINE HCL (PF) 2 % IJ SOLN
INTRAMUSCULAR | Status: AC
  Filled 2019-01-21: qty 10

## 2019-01-21 MED ORDER — ONDANSETRON HCL 4 MG/2ML IJ SOLN: 4.0000 mg | Freq: Once | INTRAMUSCULAR | Status: DC | PRN

## 2019-01-21 SURGICAL SUPPLY — 57 items
ADH SKN CLS APL DERMABOND .7 (GAUZE/BANDAGES/DRESSINGS) ×1
APL PRP STRL LF DISP 70% ISPRP (MISCELLANEOUS) ×1
APL SWBSTK 6 STRL LF DISP (MISCELLANEOUS) ×1
APPLICATOR COTTON TIP 6 STRL (MISCELLANEOUS) ×1 IMPLANT
APPLICATOR COTTON TIP 6IN STRL (MISCELLANEOUS) ×2
APPLIER CLIP 5 13 M/L LIGAMAX5 (MISCELLANEOUS) ×2
APR CLP MED LRG 5 ANG JAW (MISCELLANEOUS) ×1
BAG SPEC RTRVL LRG 6X4 10 (ENDOMECHANICALS) ×1
BLADE SURG 15 STRL LF DISP TIS (BLADE) ×1 IMPLANT
BLADE SURG 15 STRL SS (BLADE) ×2
BULB RESERV EVAC DRAIN JP 100C (MISCELLANEOUS) ×1 IMPLANT
CANISTER SUCT 1200ML W/VALVE (MISCELLANEOUS) ×2 IMPLANT
CHLORAPREP W/TINT 26 (MISCELLANEOUS) ×2 IMPLANT
CHOLANGIOGRAM CATH TAUT (CATHETERS) IMPLANT
CLEANER CAUTERY TIP 5X5 PAD (MISCELLANEOUS) ×1 IMPLANT
CLIP APPLIE 5 13 M/L LIGAMAX5 (MISCELLANEOUS) ×1 IMPLANT
COVER WAND RF STERILE (DRAPES) ×2 IMPLANT
DECANTER SPIKE VIAL GLASS SM (MISCELLANEOUS) ×4 IMPLANT
DERMABOND ADVANCED (GAUZE/BANDAGES/DRESSINGS) ×1
DERMABOND ADVANCED .7 DNX12 (GAUZE/BANDAGES/DRESSINGS) ×1 IMPLANT
DRAIN CHANNEL JP 15F RND 16 (MISCELLANEOUS) ×1 IMPLANT
DRAPE C-ARM XRAY 36X54 (DRAPES) ×2 IMPLANT
ELECT CAUTERY BLADE 6.4 (BLADE) ×2 IMPLANT
ELECT REM PT RETURN 9FT ADLT (ELECTROSURGICAL) ×2
ELECTRODE REM PT RTRN 9FT ADLT (ELECTROSURGICAL) ×1 IMPLANT
ENDOLOOP SUT PDS II  0 18 (SUTURE) ×1
ENDOLOOP SUT PDS II 0 18 (SUTURE) IMPLANT
GLOVE BIO SURGEON STRL SZ7 (GLOVE) ×2 IMPLANT
GOWN STRL REUS W/ TWL LRG LVL3 (GOWN DISPOSABLE) ×3 IMPLANT
GOWN STRL REUS W/TWL LRG LVL3 (GOWN DISPOSABLE) ×6
IRRIGATION STRYKERFLOW (MISCELLANEOUS) ×1 IMPLANT
IRRIGATOR STRYKERFLOW (MISCELLANEOUS) ×2
IV CATH ANGIO 12GX3 LT BLUE (NEEDLE) ×2 IMPLANT
IV NS 1000ML (IV SOLUTION) ×2
IV NS 1000ML BAXH (IV SOLUTION) ×1 IMPLANT
L-HOOK LAP DISP 36CM (ELECTROSURGICAL) ×2
LHOOK LAP DISP 36CM (ELECTROSURGICAL) ×1 IMPLANT
NEEDLE HYPO 22GX1.5 SAFETY (NEEDLE) ×4 IMPLANT
NS IRRIG 1000ML POUR BTL (IV SOLUTION) ×2 IMPLANT
PACK LAP CHOLECYSTECTOMY (MISCELLANEOUS) ×2 IMPLANT
PAD CLEANER CAUTERY TIP 5X5 (MISCELLANEOUS) ×1
PENCIL ELECTRO HAND CTR (MISCELLANEOUS) ×2 IMPLANT
POUCH SPECIMEN RETRIEVAL 10MM (ENDOMECHANICALS) ×2 IMPLANT
SCISSORS METZENBAUM CVD 33 (INSTRUMENTS) ×2 IMPLANT
SET TUBE SMOKE EVAC HIGH FLOW (TUBING) ×2 IMPLANT
SLEEVE ENDOPATH XCEL 5M (ENDOMECHANICALS) ×4 IMPLANT
SOL ANTI-FOG 6CC FOG-OUT (MISCELLANEOUS) ×1 IMPLANT
SOL FOG-OUT ANTI-FOG 6CC (MISCELLANEOUS) ×1
SPONGE LAP 18X18 RF (DISPOSABLE) ×2 IMPLANT
STOPCOCK 4 WAY LG BORE MALE ST (IV SETS) IMPLANT
SUT ETHIBOND 0 MO6 C/R (SUTURE) IMPLANT
SUT MNCRL AB 4-0 PS2 18 (SUTURE) ×2 IMPLANT
SUT VICRYL 0 AB UR-6 (SUTURE) ×4 IMPLANT
SYR 20CC LL (SYRINGE) ×2 IMPLANT
TIP RIGID 35CM EVICEL (HEMOSTASIS) ×1 IMPLANT
TROCAR XCEL BLUNT TIP 100MML (ENDOMECHANICALS) ×2 IMPLANT
TROCAR XCEL NON-BLD 5MMX100MML (ENDOMECHANICALS) ×2 IMPLANT

## 2019-01-21 NOTE — Progress Notes (Signed)
Surgeon notified: Can you order cough drops or chloraceptic spray for the patient she indicates her throat is irritaged, scrachy and some tighness. Oxygen level above 95% on room air.

## 2019-01-21 NOTE — Transfer of Care (Signed)
Immediate Anesthesia Transfer of Care Note  Patient: Lori Kaiser  Procedure(s) Performed: LAPAROSCOPIC CHOLECYSTECTOMY -No grams (N/A Abdomen)  Patient Location: PACU  Anesthesia Type:General  Level of Consciousness: sedated and Patient remains intubated per anesthesia plan  Airway & Oxygen Therapy: Patient Spontanous Breathing and Patient connected to face mask oxygen  Post-op Assessment: Report given to RN and Post -op Vital signs reviewed and stable  Post vital signs: Reviewed and stable  Last Vitals:  Vitals Value Taken Time  BP 145/60 01/21/2019 10:51 AM  Temp 36.9 C 01/21/2019 10:50 AM  Pulse 90 01/21/2019 10:53 AM  Resp 18 01/21/2019 10:53 AM  SpO2 94 % 01/21/2019 10:53 AM  Vitals shown include unvalidated device data.  Last Pain:  Vitals:   01/21/19 1050  TempSrc:   PainSc: Asleep      Patients Stated Pain Goal: 0 (68/34/19 6222)  Complications: No apparent anesthesia complications

## 2019-01-21 NOTE — Op Note (Signed)
Laparoscopic Cholecystectomy  Pre-operative Diagnosis: Acute cholecystitis  Post-operative Diagnosis: Gangrenous cholecystitis  Procedure: Laparoscopic cholecystectomy  Surgeon: Caroleen Hamman, MD FACS  Anesthesia: Gen. with endotracheal tube   Findings: Gangrenous Cholecystitis  Severe inflammatory response around infundibulum and cystic duct junction.  Inflammation causing raw edges around the liver  Estimated Blood Loss: 75cc         Drains: 15 blake drain RUQ         Specimens: Gallbladder           Complications: none   Procedure Details  The patient was seen again in the Holding Room. The benefits, complications, treatment options, and expected outcomes were discussed with the patient. The risks of bleeding, infection, recurrence of symptoms, failure to resolve symptoms, bile duct damage, bile duct leak, retained common bile duct stone, bowel injury, any of which could require further surgery and/or ERCP, stent, or papillotomy were reviewed with the patient. The likelihood of improving the patient's symptoms with return to their baseline status is good.  The patient and/or family concurred with the proposed plan, giving informed consent.  The patient was taken to Operating Room, identified as Lori Kaiser and the procedure verified as Laparoscopic Cholecystectomy.  A Time Out was held and the above information confirmed.  Prior to the induction of general anesthesia, antibiotic prophylaxis was administered. VTE prophylaxis was in place. General endotracheal anesthesia was then administered and tolerated well. After the induction, the abdomen was prepped with Chloraprep and draped in the sterile fashion. The patient was positioned in the supine position.  Cut down technique was used to enter the abdominal cavity and a Hasson trochar was placed after two vicryl stitches were anchored to the fascia. Pneumoperitoneum was then created with CO2 and tolerated well without any adverse  changes in the patient's vital signs.  Three 5-mm ports were placed in the right upper quadrant all under direct vision. All skin incisions  were infiltrated with a local anesthetic agent before making the incision and placing the trocars.   The patient was positioned  in reverse Trendelenburg, tilted slightly to the patient's left.  The gallbladder was identified, the fundus grasped and retracted cephalad. THe GB was tense and we decompressed it using the suction device. Adhesions were lysed bluntly. Meticulously dissection was performed with a the suction device until we were able to safely identified the anatomy.  The infundibulum was grasped and retracted laterally, exposing the peritoneum overlying the triangle of Calot. This was then divided and exposed in a blunt fashion. An extended critical view of the cystic duct and cystic artery was obtained.  The cystic duct was clearly identified and bluntly dissected.   Artery was double clipped and divided. Cystic duct stump was ligated using endoloop PDS . I milked some small stones from the cystic duct.  The gallbladder was taken from the gallbladder fossa in a retrograde fashion with the electrocautery. The gallbladder was removed and placed in an Endocatch bag. The liver bed was irrigated and inspected. Hemostasis was achieved with the electrocautery. Copious irrigation was utilized and was repeatedly aspirated until clear.  The gallbladder and Endocatch sac were then removed through a port site.  Drain was placed within the GB fossa and secured to the abd wall.  Inspection of the right upper quadrant was performed. No bleeding, bile duct injury or leak, or bowel injury was noted. Pneumoperitoneum was released.  The periumbilical port site was closed with interrumpted 0 Vicryl sutures. 4-0 subcuticular Monocryl was used  to close the skin. Dermabond was  applied.  The patient was then extubated and brought to the recovery room in stable condition. Sponge,  lap, and needle counts were correct at closure and at the conclusion of the case.               Caroleen Hamman, MD, FACS

## 2019-01-21 NOTE — Anesthesia Postprocedure Evaluation (Signed)
Anesthesia Post Note  Patient: Lori Kaiser  Procedure(s) Performed: LAPAROSCOPIC CHOLECYSTECTOMY -No grams (N/A Abdomen)  Patient location during evaluation: PACU Anesthesia Type: General Level of consciousness: awake and alert Pain management: pain level controlled Vital Signs Assessment: post-procedure vital signs reviewed and stable Respiratory status: spontaneous breathing, nonlabored ventilation, respiratory function stable and patient connected to nasal cannula oxygen Cardiovascular status: blood pressure returned to baseline and stable Postop Assessment: no apparent nausea or vomiting Anesthetic complications: no     Last Vitals:  Vitals:   01/21/19 1132 01/21/19 1136  BP:  (!) 154/81  Pulse: 83 82  Resp: 11 14  Temp:    SpO2: 97% 98%    Last Pain:  Vitals:   01/21/19 1136  TempSrc:   PainSc: 0-No pain                 Taja Pentland S

## 2019-01-21 NOTE — Care Management (Signed)
Pt very anxious. Versed given with good results. VS good.

## 2019-01-21 NOTE — Anesthesia Post-op Follow-up Note (Signed)
Anesthesia QCDR form completed.        

## 2019-01-21 NOTE — H&P (Signed)
Patient ID: BEN SANZ, female   DOB: 25-Nov-1960, 58 y.o.   MRN: [id number]  HPI ALISSIA LORY is a 58 y.o. female morbidly obese female with a acute onset of right upper quadrant pain.  Reports that the pain is been there for about 24 hours and actually has been worsening over the last 12 hours.  She has required high doses of narcotics without any significant improvement of the pain.  She had around-the-clock Tylenol, IV morphine and oxycodone.  The pain is sharp radiated to the back as well associated with nausea and decreased appetite.  No fevers no chills no respiratory symptoms.  No evidence of COVID-19 symptoms. No evidence of biliary obstruction.  She is able to perform more than 4 METS of activity without any shortness of breath or chest pain.  Did have a history of C-section for twins. Ultrasound personally reviewed showing evidence of some pericholecystic fluid and mild gallbladder thickening.  There is normal biliary anatomy.  LFTs are normal.  White count is elevated.   HPI  Past Medical History:  Diagnosis Date  . allergies   . Allergy   . Anemia   . Anxiety   . Blood transfusion without reported diagnosis   . Chronic headaches   . Depression   . Eating disorder   . Fatigue   . GERD (gastroesophageal reflux disease)   . History of chicken pox   . Irritable bowel syndrome (IBS)   . Migraine   . Urine incontinence     Past Surgical History:  Procedure Laterality Date  . CESAREAN SECTION  1996  . Dental Implant  2013  . 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 Heart disease Father 85  . Atrial fibrillation Father   . Heart disease Paternal Aunt   . Heart disease Paternal Grandmother   . Lung cancer Paternal Grandmother   . Stroke Paternal Grandmother   . Lung cancer Paternal Grandfather   . Lung cancer Maternal Grandfather   . Kidney cancer Maternal Grandmother     Social  History Social History   Tobacco Use  . Smoking status: Former Smoker    Packs/day: 1.00    Years: 12.00    Pack years: 12.00    Types: Cigarettes  . Smokeless tobacco: Never Used  Substance Use Topics  . Alcohol use: No  . Drug use: No    Allergies  Allergen Reactions  . Zithromax [Azithromycin]   . Penicillins Rash    Did it involve swelling of the face/tongue/throat, SOB, or low BP? No Did it involve sudden or severe rash/hives, skin peeling, or any reaction on the inside of your mouth or nose? YES Did you need to seek medical attention at a hospital or doctor's office? No When did it last happen? If all above answers are "NO", may proceed with cephalosporin use.    Current Facility-Administered Medications  Medication Dose Route Frequency Provider Last Rate Last Dose  . 0.9 %  sodium chloride infusion   Intravenous Continuous Caroleen Hamman F, MD 150 mL/hr at 01/21/19 0548    . acetaminophen (TYLENOL) tablet 1,000 mg  1,000 mg Oral Q6H ,  F, MD   1,000 mg at 01/21/19 0543  . ciprofloxacin (CIPRO) IVPB 400 mg  400 mg Intravenous Q12H Jules Husbands, MD   Stopped at 01/20/19 2343  . heparin injection 5,000 Units  5,000 Units Subcutaneous Q8H , [place] F, MD      . hydrALAZINE (APRESOLINE) injection 10 mg  10 mg Intravenous Q2H PRN ,  F, MD      . ketorolac (TORADOL) 30 MG/ML injection 30 mg  30 mg Intravenous Q6H PRN ,  F, MD   30 mg at 01/21/19 0740  . metroNIDAZOLE (FLAGYL) IVPB 500 mg  500 mg Intravenous Q8H ,  F, MD 100 mL/hr at 01/21/19 0549 500 mg at 01/21/19 0549  . morphine 4 MG/ML injection 4 mg  4 mg Intravenous Q3H PRN ,  F, MD      . ondansetron (ZOFRAN-ODT) disintegrating tablet 4 mg  4 mg Oral Q6H PRN ,  F, MD       Or  . ondansetron (ZOFRAN) injection 4 mg  4 mg Intravenous Q6H PRN Jules Husbands, MD   4 mg at 01/21/19 0117  . oxyCODONE (Oxy IR/ROXICODONE) immediate release tablet 5-10 mg  5-10  mg Oral Q4H PRN Caroleen Hamman F, MD   5 mg at 01/21/19 0543  . pantoprazole (PROTONIX) injection 40 mg  40 mg Intravenous QHS Caroleen Hamman F, MD   40 mg at 01/20/19 2109  . prochlorperazine (COMPAZINE) tablet 10 mg  10 mg Oral Q6H PRN ,  F, MD       Or  . prochlorperazine (COMPAZINE) injection 5-10 mg  5-10 mg Intravenous Q6H PRN ,  F, MD         Review of Systems Full ROS  was asked and was negative except for the information on the HPI  Physical Exam Blood pressure 128/73, pulse 78, temperature (!) 97.3 F (36.3 C), temperature source Oral, resp. rate 18, height 5\' 7"  (1.702 m), weight 122.5 kg, last menstrual period 01/24/2014, SpO2 97 %. CONSTITUTIONAL: uncomfortable c/o pain EYES: Pupils are equal, round, and reactive to light, Sclera are non-icteric. EARS, NOSE, MOUTH AND THROAT: The oropharynx is clear. The oral mucosa is pink and moist. Hearing is intact to voice. LYMPH NODES:  Lymph nodes in the neck are normal. RESPIRATORY:  Lungs are clear. There is normal respiratory effort, with equal breath sounds bilaterally, and without pathologic use of accessory muscles. CARDIOVASCULAR: Heart is regular without murmurs, gallops, or rubs. GI: The abdomen is  soft, TTP RUQ with obvious Murphy sign. There are no palpable masses. There is no hepatosplenomegaly. There are normal bowel sounds in all quadrants. GU: Rectal deferred.   MUSCULOSKELETAL: Normal muscle strength and tone. No cyanosis or edema.   SKIN: Turgor is good and there are no pathologic skin lesions or ulcers. NEUROLOGIC: Motor and sensation is grossly normal. Cranial nerves are grossly intact. PSYCH:  Oriented to person, place and time. Affect is normal.  Data Reviewed  I have personally reviewed the patient's imaging, laboratory findings and medical records.    Assessment/Plan This is a 58 year old female with classic signs and symptoms consistent with acute cholecystitis.  She continues to have pain  despite aggressive narcotic, NSAIDs Tylenol and multimodal approach to pain.  She does have a positive Murphy sign and given all this signs and symptoms I do recommend cholecystectomy.  Given the covid 19 epidemic she understands that she is a high risk for developing complications from the [id number]-[id number].  On the other hand I do think that if we do a timely operation we will decrease the amount of PPI spent and decrease the length of hospitalization.  Even acute cholecystitis and crescendo symptoms and worsening pain the best  therapy is to proceed with surgical intervention. I discussed the procedure in detail.  The patient was given Neurosurgeon.  We discussed the risks and benefits of a laparoscopic cholecystectomy and possible cholangiogram including, but not limited to bleeding, infection, injury to surrounding structures such as the intestine or liver, bile leak, retained gallstones, need to convert to an open procedure, prolonged diarrhea, blood clots such as  DVT, common bile duct injury, anesthesia risks, and possible need for additional procedures.  The likelihood of improvement in symptoms and return to the patient's normal status is good. We discussed the typical post-operative recovery course. We will Proceed with promptly laparoscopic cholecystectomy   Caroleen Hamman, MD FACS General Surgeon 01/21/2019, 8:31 AM

## 2019-01-21 NOTE — Anesthesia Preprocedure Evaluation (Addendum)
Anesthesia Evaluation  Patient identified by MRN, date of birth, ID band Patient awake    Reviewed: Allergy & Precautions, NPO status , Patient's Chart, lab work & pertinent test results, reviewed documented beta blocker date and time   Airway Mallampati: III  TM Distance: >3 FB     Dental  (+) Chipped   Pulmonary former smoker,           Cardiovascular      Neuro/Psych  Headaches, PSYCHIATRIC DISORDERS Anxiety Depression    GI/Hepatic GERD  Controlled,  Endo/Other  Morbid obesity  Renal/GU      Musculoskeletal   Abdominal   Peds  Hematology  (+) anemia ,   Anesthesia Other Findings Past Medical History: No date: allergies No date: Allergy No date: Anemia No date: Anxiety No date: Blood transfusion without reported diagnosis No date: Chronic headaches No date: Depression No date: Eating disorder No date: Fatigue No date: GERD (gastroesophageal reflux disease) No date: History of chicken pox No date: Irritable bowel syndrome (IBS) No date: Migraine No date: Urine incontinence   Reproductive/Obstetrics                            Anesthesia Physical Anesthesia Plan  ASA: III  Anesthesia Plan: General   Post-op Pain Management:    Induction: Intravenous  PONV Risk Score and Plan:   Airway Management Planned: Oral ETT  Additional Equipment:   Intra-op Plan:   Post-operative Plan:   Informed Consent: I have reviewed the patients History and Physical, chart, labs and discussed the procedure including the risks, benefits and alternatives for the proposed anesthesia with the patient or authorized representative who has indicated his/her understanding and acceptance.       Plan Discussed with: CRNA  Anesthesia Plan Comments:         Anesthesia Quick Evaluation

## 2019-01-21 NOTE — Anesthesia Procedure Notes (Signed)
Procedure Name: Intubation Date/Time: 01/21/2019 9:14 AM Performed by: Jonna Clark, CRNA Pre-anesthesia Checklist: Patient identified, Patient being monitored, Timeout performed, Emergency Drugs available and Suction available Patient Re-evaluated:Patient Re-evaluated prior to induction Oxygen Delivery Method: Circle system utilized Preoxygenation: Pre-oxygenation with 100% oxygen Induction Type: IV induction Ventilation: Mask ventilation without difficulty Laryngoscope Size: Mac and 3 Grade View: Grade III Tube type: Oral Tube size: 7.0 mm Number of attempts: 1 Airway Equipment and Method: Stylet Placement Confirmation: ETT inserted through vocal cords under direct vision,  positive ETCO2 and breath sounds checked- equal and bilateral Secured at: 21 cm Tube secured with: Tape Dental Injury: Teeth and Oropharynx as per pre-operative assessment  Difficulty Due To: Difficult Airway- due to anterior larynx Future Recommendations: Recommend- induction with short-acting agent, and alternative techniques readily available

## 2019-01-22 MED ORDER — HYDROCODONE-ACETAMINOPHEN 5-325 MG PO TABS: 1.0000 | ORAL_TABLET | Freq: Four times a day (QID) | ORAL | 0 refills | Status: DC | PRN

## 2019-01-22 MED ORDER — CIPROFLOXACIN HCL 500 MG PO TABS: 500.0000 mg | ORAL_TABLET | Freq: Two times a day (BID) | ORAL | 0 refills | Status: AC

## 2019-01-22 NOTE — Discharge Summary (Signed)
Patient ID: ALIESE BRANNUM MRN: [id number] DOB/AGE: Jan 08, 1961 58 y.o.  Admit date: 01/20/2019 Discharge date: 01/22/2019   Discharge Diagnoses:  Active Problems:   Cholecystitis   Procedures: Lap chole  Hospital Course: 58 year old female with acute cholecystitis taken to the OR appropriately where we found gangrenous cholecystitis.  A drain was placed and she was kept overnight.  She did very well.  At the time of discharge she was ambulating, tolerating regular diet vital signs were stable.  Her physical exam showed a female in no acute distress awake alert.  Abdomen: Soft, incisions healing well without infection.  JP with serous fluid.  No peritonitis.  Extremities well-perfused and no edema.  Condition of the patient at time of discharge stable she will go with the drain home I will see her back in 1 week.  Given her severe cholecystitis and gangrenous nature I will prescribe a short course of antibiotics    Disposition: Discharge disposition: 01-Home or Self Care       Discharge Instructions    Call MD for:  difficulty breathing, headache or visual disturbances   Complete by:  As directed    Call MD for:  extreme fatigue   Complete by:  As directed    Call MD for:  hives   Complete by:  As directed    Call MD for:  persistant dizziness or light-headedness   Complete by:  As directed    Call MD for:  persistant nausea and vomiting   Complete by:  As directed    Call MD for:  redness, tenderness, or signs of infection (pain, swelling, redness, odor or green/yellow discharge around incision site)   Complete by:  As directed    Call MD for:  severe uncontrolled pain   Complete by:  As directed    Call MD for:  temperature >100.4   Complete by:  As directed    Diet - low sodium heart healthy   Complete by:  As directed    Discharge instructions   Complete by:  As directed    Please teach pt about JP care. She is to go home with it.   Increase activity slowly    Complete by:  As directed      Allergies as of 01/22/2019      Reactions   Zithromax [azithromycin]    Penicillins Rash   Did it involve swelling of the face/tongue/throat, SOB, or low BP? No Did it involve sudden or severe rash/hives, skin peeling, or any reaction on the inside of your mouth or nose? YES Did you need to seek medical attention at a hospital or doctor's office? No When did it last happen? If all above answers are "NO", may proceed with cephalosporin use.      Medication List    TAKE these medications   ciprofloxacin 500 MG tablet Commonly known as:  Cipro Take 1 tablet (500 mg total) by mouth 2 (two) times daily for 5 days.   famotidine 20 MG tablet Commonly known as:  Pepcid Take 1 tablet by mouth once to twice daily for heartburn.   FLUoxetine 20 MG tablet Commonly known as:  PROZAC Take 1 tablet (20 mg total) by mouth daily. For anxiety.   fluticasone 50 MCG/ACT nasal spray Commonly known as:  FLONASE Place 1 spray into both nostrils daily as needed for allergies.   HYDROcodone-acetaminophen 5-325 MG tablet Commonly known as:  NORCO/VICODIN Take 1-2 tablets by mouth every 6 (six) hours as needed  for moderate pain.   topiramate 50 MG tablet Commonly known as:  TOPAMAX TAKE 1 TABLET (50 MG TOTAL) BY MOUTH AT BEDTIME. FOR MIGRAINE PREVENTION.         Caroleen Hamman, MD FACS

## 2019-01-22 NOTE — Discharge Instructions (Signed)

## 2019-01-23 ENCOUNTER — Telehealth: Payer: Self-pay | Admitting: Surgery

## 2019-01-23 ENCOUNTER — Encounter: Payer: Self-pay | Admitting: Surgery

## 2019-01-23 LAB — HIV ANTIBODY (ROUTINE TESTING W REFLEX): HIV Screen 4th Generation wRfx: NONREACTIVE

## 2019-01-23 NOTE — Telephone Encounter (Signed)
Spoke with patient. She states she is having pain. She states the hydrocodone doesn't help. We discussed trying Tylenol and ibuprofen and she has decided to use this instead of the narcotics.   She also stated she is sore and it hurts to breathe deep however she was encouraged to take deep breaths and that the soreness will improve daily, otherwise she is doing ok.  If she gets short of breathe or has fever to call office.

## 2019-01-23 NOTE — Telephone Encounter (Signed)
Patient called to schedule her appointment, patient did state she was having some pain around a 5 or 6 level and also has some questions. Please call patient and advise.

## 2019-01-24 ENCOUNTER — Telehealth: Payer: Self-pay | Admitting: *Deleted

## 2019-01-24 LAB — SURGICAL PATHOLOGY

## 2019-01-24 NOTE — Telephone Encounter (Signed)
Patient called and stated that she had surgery on 01/21/19 and she is still having a lot of pain, she is taking 2 advail and 1 tylenol every 6 hours, she also complains of  being constipated and wants to know what she can take to relieve it.

## 2019-01-24 NOTE — Telephone Encounter (Signed)
Patient states that the Tylenol with Ibuprofen is working some. She states that she has not been using heat. Patient encouraged to use a heating pad to the area several times a day. She also needs to start using a stool soften and to start Miralax 1 capful with a full glass of fluid daily.

## 2019-01-30 ENCOUNTER — Ambulatory Visit (INDEPENDENT_AMBULATORY_CARE_PROVIDER_SITE_OTHER): Payer: Federal, State, Local not specified - PPO | Admitting: Surgery

## 2019-01-30 ENCOUNTER — Encounter: Payer: Self-pay | Admitting: Surgery

## 2019-01-30 ENCOUNTER — Ambulatory Visit
Admission: RE | Admit: 2019-01-30 | Discharge: 2019-01-30 | Disposition: A | Discharge status: Home / Self Care | Payer: Federal, State, Local not specified - PPO | Source: Ambulatory Visit | Attending: Surgery | Admitting: Surgery

## 2019-01-30 ENCOUNTER — Other Ambulatory Visit: Payer: Federal, State, Local not specified - PPO

## 2019-01-30 ENCOUNTER — Other Ambulatory Visit
Admission: RE | Admit: 2019-01-30 | Discharge: 2019-01-30 | Disposition: A | Discharge status: Home / Self Care | Payer: Federal, State, Local not specified - PPO | Source: Ambulatory Visit | Attending: Surgery | Admitting: Surgery

## 2019-01-30 ENCOUNTER — Telehealth: Payer: Self-pay | Admitting: *Deleted

## 2019-01-30 ENCOUNTER — Other Ambulatory Visit: Payer: Self-pay

## 2019-01-30 ENCOUNTER — Ambulatory Visit (INDEPENDENT_AMBULATORY_CARE_PROVIDER_SITE_OTHER): Payer: Federal, State, Local not specified - PPO

## 2019-01-30 VITALS — BP 161/94 | HR 90 | Temp 99.1°F | Ht 67.0 in | Wt 281.0 lb

## 2019-01-30 DIAGNOSIS — R109 Unspecified abdominal pain: Secondary | ICD-10-CM | POA: Diagnosis not present

## 2019-01-30 DIAGNOSIS — K819 Cholecystitis, unspecified: Secondary | ICD-10-CM

## 2019-01-30 LAB — COMPREHENSIVE METABOLIC PANEL
ALT: 19 U/L (ref 0–44)
AST: 19 U/L (ref 15–41)
Albumin: 3.4 g/dL — ABNORMAL LOW (ref 3.5–5.0)
Alkaline Phosphatase: 98 U/L (ref 38–126)
Anion gap: 10 (ref 5–15)
BUN: 6 mg/dL (ref 6–20)
CO2: 24 mmol/L (ref 22–32)
Calcium: 8.6 mg/dL — ABNORMAL LOW (ref 8.9–10.3)
Chloride: 105 mmol/L (ref 98–111)
Creatinine, Ser: 0.68 mg/dL (ref 0.44–1.00)
GFR calc Af Amer: >60 mL/min (ref >60)
GFR calc non Af Amer: >60 mL/min (ref >60)
Glucose, Bld: 109 mg/dL — ABNORMAL HIGH (ref 70–99)
Potassium: 3.4 mmol/L — ABNORMAL LOW (ref 3.5–5.1)
Sodium: 139 mmol/L (ref 135–145)
Total Bilirubin: 1 mg/dL (ref 0.3–1.2)
Total Protein: 7.5 g/dL (ref 6.5–8.1)

## 2019-01-30 LAB — CBC WITH DIFFERENTIAL/PLATELET
Abs Immature Granulocytes: 0.04 10*3/uL (ref 0.00–0.07)
Basophils Absolute: 0.1 10*3/uL (ref 0.0–0.1)
Basophils Relative: 1 %
Eosinophils Absolute: 0.1 10*3/uL (ref 0.0–0.5)
Eosinophils Relative: 1 %
HCT: 37.8 % (ref 36.0–46.0)
Hemoglobin: 12.4 g/dL (ref 12.0–15.0)
Immature Granulocytes: 0 %
Lymphocytes Relative: 20 %
Lymphs Abs: 2.3 10*3/uL (ref 0.7–4.0)
MCH: 29.2 pg (ref 26.0–34.0)
MCHC: 32.8 g/dL (ref 30.0–36.0)
MCV: 88.9 fL (ref 80.0–100.0)
Monocytes Absolute: 0.5 10*3/uL (ref 0.1–1.0)
Monocytes Relative: 5 %
Neutro Abs: 8.2 10*3/uL — ABNORMAL HIGH (ref 1.7–7.7)
Neutrophils Relative %: 73 %
Platelets: 313 10*3/uL (ref 150–400)
RBC: 4.25 MIL/uL (ref 3.87–5.11)
RDW: 12.8 % (ref 11.5–15.5)
WBC: 11.2 10*3/uL — ABNORMAL HIGH (ref 4.0–10.5)
nRBC: 0 % (ref 0.0–0.2)

## 2019-01-30 MED ORDER — CEFDINIR 300 MG PO CAPS: 300.0000 mg | ORAL_CAPSULE | Freq: Two times a day (BID) | ORAL | 0 refills | Status: AC

## 2019-01-30 NOTE — Patient Instructions (Addendum)
Return in one week. Rx sent , Take for one week.

## 2019-01-30 NOTE — Progress Notes (Signed)
S/p lap chole 4/11 ( gangrenous) Doing well No fevers or chills Taking PO Path d/w pt Pain much better w minimal pain today Only 2cc day from JP but now is dark  PE NAD AVSS Abd: soft, drain w dark fluid, less 2cc in drain. No cellulitis, incisions d/c/i.No peritonitis. Drain had a clot but I was unable to remove the clot. I remove the drain.  A/P Doing well clinically Given the dark output from the drain I am concerned about an intra-abdominal abscess or biloma I will ordr CBC, cmp and U/S We will prescribe an additional week of omnicef Since she had a difficult time w cipro. We will call w results and see her back next week if w/u is negative

## 2019-01-30 NOTE — Telephone Encounter (Signed)
Called patient and told her her labs was ok .per [place], Nothing alarming with ultrasound and labs. Will see patient next week.

## 2019-02-06 ENCOUNTER — Other Ambulatory Visit: Payer: Self-pay

## 2019-02-06 ENCOUNTER — Ambulatory Visit (INDEPENDENT_AMBULATORY_CARE_PROVIDER_SITE_OTHER): Payer: Federal, State, Local not specified - PPO | Admitting: Surgery

## 2019-02-06 ENCOUNTER — Encounter: Payer: Self-pay | Admitting: Surgery

## 2019-02-06 VITALS — BP 163/81 | HR 96 | Temp 98.6°F | Resp 16 | Ht 67.0 in | Wt 275.4 lb

## 2019-02-06 DIAGNOSIS — Z09 Encounter for follow-up examination after completed treatment for conditions other than malignant neoplasm: Secondary | ICD-10-CM

## 2019-02-06 NOTE — Patient Instructions (Addendum)
Please call our office if you have questions or concerns.     GENERAL POST-OPERATIVE PATIENT INSTRUCTIONS   WOUND CARE INSTRUCTIONS:  Keep a dry clean dressing on the wound if there is drainage. The initial bandage may be removed after 24 hours.  Once the wound has quit draining you may leave it open to air.  If clothing rubs against the wound or causes irritation and the wound is not draining you may cover it with a dry dressing during the daytime.  Try to keep the wound dry and avoid ointments on the wound unless directed to do so.  If the wound becomes bright red and painful or starts to drain infected material that is not clear, please contact your physician immediately.  If the wound is mildly pink and has a thick firm ridge underneath it, this is normal, and is referred to as a healing ridge.  This will resolve over the next 4-6 weeks.  BATHING: You may shower if you have been informed of this by your surgeon. However, Please do not submerge in a tub, hot tub, or pool until incisions are completely sealed or have been told by your surgeon that you may do so.  DIET:  You may eat any foods that you can tolerate.  It is a good idea to eat a high fiber diet and take in plenty of fluids to prevent constipation.  If you do become constipated you may want to take a mild laxative or take ducolax tablets on a daily basis until your bowel habits are regular.  Constipation can be very uncomfortable, along with straining, after recent surgery.  ACTIVITY:  You are encouraged to cough and deep breath or use your incentive spirometer if you were given one, every 15-30 minutes when awake.  This will help prevent respiratory complications and low grade fevers post-operatively if you had a general anesthetic.  You may want to hug a pillow when coughing and sneezing to add additional support to the surgical area, if you had abdominal or chest surgery, which will decrease pain during these times.  You are encouraged  to walk and engage in light activity for the next two weeks.  You should not lift more than 20 pounds, until 02/25/2019 as it could put you at increased risk for complications.  Twenty pounds is roughly equivalent to a plastic bag of groceries. At that time- Listen to your body when lifting, if you have pain when lifting, stop and then try again in a few days. Soreness after doing exercises or activities of daily living is normal as you get back in to your normal routine.  MEDICATIONS:  Try to take narcotic medications and anti-inflammatory medications, such as tylenol, ibuprofen, naprosyn, etc., with food.  This will minimize stomach upset from the medication.  Should you develop nausea and vomiting from the pain medication, or develop a rash, please discontinue the medication and contact your physician.  You should not drive, make important decisions, or operate machinery when taking narcotic pain medication.  SUNBLOCK Use sun block to incision area over the next year if this area will be exposed to sun. This helps decrease scarring and will allow you avoid a permanent darkened area over your incision.  QUESTIONS:  Please feel free to call our office if you have any questions, and we will be glad to assist you. [phone]

## 2019-02-06 NOTE — Progress Notes (Signed)
S/p la chole for gangrenous cholecystitis Doing very well Pain free Taking po U/S c/w small hematoma No evidence of bile leak She is clinically asymptomatic  PE NAD Abd: soft, nt, incisions healing well, no infection. No peritonitis  A/P  Doing well after chole No heavy lifting RTC prn

## 2019-03-03 ENCOUNTER — Ambulatory Visit (INDEPENDENT_AMBULATORY_CARE_PROVIDER_SITE_OTHER): Payer: Federal, State, Local not specified - PPO | Admitting: Primary Care

## 2019-03-03 ENCOUNTER — Encounter: Payer: Self-pay | Admitting: Primary Care

## 2019-03-03 VITALS — Wt 275.0 lb

## 2019-03-03 DIAGNOSIS — W57XXXA Bitten or stung by nonvenomous insect and other nonvenomous arthropods, initial encounter: Secondary | ICD-10-CM | POA: Diagnosis not present

## 2019-03-03 DIAGNOSIS — S20469A Insect bite (nonvenomous) of unspecified back wall of thorax, initial encounter: Secondary | ICD-10-CM | POA: Diagnosis not present

## 2019-03-03 NOTE — Progress Notes (Signed)
Subjective:    Patient ID: Lori Kaiser, female    DOB: 11/17/1960, 59 y.o.   MRN: [id number]  HPI  Virtual Visit via Video Note  I connected with Lori Kaiser on 03/03/19 at  9:40 AM EDT by a video enabled telemedicine application and verified that I am speaking with the correct person using two identifiers.  Location: Patient: Home Provider: Office   I discussed the limitations of evaluation and management by telemedicine and the availability of in person appointments. The patient expressed understanding and agreed to proceed.  History of Present Illness:  Lori Kaiser is a 58 year old female with a history of allergic rhinitis, migraines, GERD, IBS who presents today with a chief complaint of tick bite.   She noticed a spot with redness to her mid upper thoracic back three days ago, didn't think much of this. She continued to notice the spot last night with a red ring so she had her daughter take a look. Her daughter noticed a tick on her back and removed it. The redness has progressed since three days ago. She's also noticed a red ring around the site. The tick was small overall. She denies nausea, fatigue, fevers, chills.   She did undergo cholecystectomy in early April 2020.   Observations/Objective:  Alert and oriented. Appears well, not sickly. No distress. Speaking in complete sentences.  She could not aim the camera for me to visualize the tick bite. She will send a photo via my chart.   Assessment and Plan:  Did not get to visualize her tick bite, she did send a photo via my chart. Bite doesn't appear to be Lyme or RMSF, appears to be normal localized reaction from bite. She doesn't seem sickly. Discussed typical symptoms of tick bites vs Lyme/RMSF. Also recommended she continue to monitor the site over the next week and to send another photo in 2-3 days.   Follow Up Instructions:  Monitor the site as discussed.  The site doesn't appear to have Lyme Disease.   Monitor for symptoms of fevers, fatigue, nausea, night sweats, headaches.   It was a pleasure to see you today! Lori Bossier, NP-C    I discussed the assessment and treatment plan with the patient. The patient was provided an opportunity to ask questions and all were answered. The patient agreed with the plan and demonstrated an understanding of the instructions.   The patient was advised to call back or seek an in-person evaluation if the symptoms worsen or if the condition fails to improve as anticipated.     Lori Koch, NP    Review of Systems  Constitutional: Negative for fever.  Respiratory: Negative for shortness of breath.   Gastrointestinal: Negative for nausea.  Skin: Positive for wound.  Neurological: Negative for dizziness and headaches.       Past Medical History:  Diagnosis Date  . allergies   . Allergy   . Anemia   . Anxiety   . Blood transfusion without reported diagnosis   . Chronic headaches   . Depression   . Eating disorder   . Fatigue   . GERD (gastroesophageal reflux disease)   . History of chicken pox   . Irritable bowel syndrome (IBS)   . Migraine   . Urine incontinence      Social History   Socioeconomic History  . Marital status: Married    Spouse name: Not on file  . Number of children: 2  . Years  of education: Not on file  . Highest education level: Not on file  Occupational History  . 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 Financial resource strain: Not on file  . Food insecurity:    Worry: Not on file    Inability: Not on file  . Transportation needs:    Medical: Not on file    Non-medical: Not on file  Tobacco Use  . Smoking status: Former Smoker    Packs/day: 1.00    Years: 12.00    Pack years: 12.00    Types: Cigarettes  . Smokeless tobacco: Never Used  Substance and Sexual Activity  . Alcohol use: No  . Drug use: No  . Sexual activity: Never  Lifestyle  . Physical  activity:    Days per week: Not on file    Minutes per session: Not on file  . Stress: Not on file  Relationships  . Social connections:    Talks on phone: Not on file    Gets together: Not on file    Attends religious service: Not on file    Active member of club or organization: Not on file    Attends meetings of clubs or organizations: Not on file    Relationship status: Not on file  . Intimate partner violence:    Fear of current or ex partner: Not on file    Emotionally abused: Not on file    Physically abused: Not on file    Forced sexual activity: Not on file  Other Topics Concern  . Not on file  Social History Narrative   Divorced, twins age 20   Diet: moderate, water   Exercise: walks sporadically    Past Surgical History:  Procedure Laterality Date  . CESAREAN SECTION  1996  . CHOLECYSTECTOMY N/A 01/21/2019   Procedure: LAPAROSCOPIC CHOLECYSTECTOMY -No grams;  Surgeon: Jules Husbands, MD;  Location: [facility] ORS;  Service: General;  Laterality: N/A;  . Dental Implant  2013  . 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 Heart disease Father 61  . Atrial fibrillation Father   . Heart disease Paternal Aunt   . Heart disease Paternal Grandmother   . Lung cancer Paternal Grandmother   . Stroke Paternal Grandmother   . Lung cancer Paternal Grandfather   . Lung cancer Maternal Grandfather   . Kidney cancer Maternal Grandmother     Allergies  Allergen Reactions  . Zithromax [Azithromycin]   . Penicillins Rash    Did it involve swelling of the face/tongue/throat, SOB, or low BP? No Did it involve sudden or severe rash/hives, skin peeling, or any reaction on the inside of your mouth or nose? YES Did you need to seek medical attention at a hospital or doctor's office? No When did it last happen? If all above answers are "NO", may proceed with cephalosporin use.    No current outpatient  medications on file prior to visit.   No current facility-administered medications on file prior to visit.     Wt 275 lb (124.7 kg)   LMP 09/25/2018   BMI 43.07 kg/m    Objective:   Physical Exam  Constitutional: She appears well-nourished.  Respiratory: Effort normal.  Skin: Skin is dry. There is erythema.  <1 cm circular raised spot to mid thoracic back with raised bite. No rings, no central  clearing, no drainage.            Assessment & Plan:

## 2019-03-03 NOTE — Patient Instructions (Signed)
  Monitor the site as discussed.  The site doesn't appear to have Lyme Disease.  Monitor for symptoms of fevers, fatigue, nausea, night sweats, headaches.   It was a pleasure to see you today! Allie Bossier, NP-C

## 2019-03-31 ENCOUNTER — Encounter: Payer: Self-pay | Admitting: Primary Care

## 2019-03-31 ENCOUNTER — Ambulatory Visit (INDEPENDENT_AMBULATORY_CARE_PROVIDER_SITE_OTHER): Payer: Federal, State, Local not specified - PPO | Admitting: Primary Care

## 2019-03-31 ENCOUNTER — Telehealth: Payer: Self-pay | Admitting: Primary Care

## 2019-03-31 DIAGNOSIS — R21 Rash and other nonspecific skin eruption: Secondary | ICD-10-CM

## 2019-03-31 MED ORDER — PREDNISONE 20 MG PO TABS: ORAL_TABLET | ORAL | 0 refills | Status: DC

## 2019-03-31 NOTE — Telephone Encounter (Signed)
Patient called to schedule appt. She is scheduled for Monday @ 12:00 She has a rash all over her body that she states keeps her up all night. She has tried several OTC medications and nothing seems to help   Do you need to see her sooner?     Patient c/b # [phone]

## 2019-03-31 NOTE — Progress Notes (Signed)
Subjective:    Patient ID: Lori Kaiser, female    DOB: 24-Oct-1960, 58 y.o.   MRN: [id number]  HPI  Virtual Visit via Video Note  I connected with Lori Kaiser on 03/31/19 at  3:40 PM EDT by a video enabled telemedicine application and verified that I am speaking with the correct person using two identifiers.  Location: Patient: Home Provider: Office   I discussed the limitations of evaluation and management by telemedicine and the availability of in person appointments. The patient expressed understanding and agreed to proceed.  History of Present Illness:  [name] is a 58 year old female with a history of allergic rhinitis, GERD, IBS, anxiety disorder who presents today with a chief complaint of rash.   Her rash began to her bilateral hands and dorsal feet for which she noticed one week ago. Since then the rash has spread to her bilateral lower legs and forearms. The rash is itchy in nature. She denies changes in soaps, detergents, food, mediations. She has not been out in the woods or working in the yard. She's taken benadryl and applied OTC cortisone cream without significant improvement.    Observations/Objective:  Alert and oriented. Appears well, not sickly. No distress. Speaking in complete sentences.  Pruritic appearing rash to bilateral forearms and lower extremities, dorsal feet representational of allergic dermatitis.   Assessment and Plan:  Pruritic rash x one week, worse with spread to extremities. Exam today consistent for allergic dermatitis. Unclear cause. Little improvement with OTC treatment. Given widespread nature will treat with prednisone course. Rx sent to pharmacy. She will update.  Follow Up Instructions:  Start prednisone for rash. Take 2 tablets for three days, then 1 tablet for three days.   You can switch to Zyrtec or Allegra to help with itching. Do not take benadryl with these medications.  It was a pleasure to see you today!   I discussed the assessment and treatment plan with the patient. The patient was provided an opportunity to ask questions and all were answered. The patient agreed with the plan and demonstrated an understanding of the instructions.   The patient was advised to call back or seek an in-person evaluation if the symptoms worsen or if the condition fails to improve as anticipated.     Pleas Koch, NP    Review of Systems  Constitutional: Negative for fever.  Respiratory: Negative for shortness of breath and wheezing.   Skin: Positive for rash. Negative for wound.       Past Medical History:  Diagnosis Date  . allergies   . Allergy   . Anemia   . Anxiety   . Blood transfusion without reported diagnosis   . Chronic headaches   . Depression   . Eating disorder   . Fatigue   . GERD (gastroesophageal reflux disease)   . History of chicken pox   . Irritable bowel syndrome (IBS)   . Migraine   . Urine incontinence      Social History   Socioeconomic History  . Marital status: Married    Spouse name: Not on file  . Number of children: 2  . Years of education: Not on file  . Highest education level: Not on file  Occupational History  . 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 Financial resource strain: Not on file  . Food insecurity    Worry: Not on file    Inability: Not  on file  . Transportation needs    Medical: Not on file    Non-medical: Not on file  Tobacco Use  . Smoking status: Former Smoker    Packs/day: 1.00    Years: 12.00    Pack years: 12.00    Types: Cigarettes  . Smokeless tobacco: Never Used  Substance and Sexual Activity  . Alcohol use: No  . Drug use: No  . Sexual activity: Never  Lifestyle  . Physical activity    Days per week: Not on file    Minutes per session: Not on file  . Stress: Not on file  Relationships  . Social Herbalist on phone: Not on file    Gets together: Not on file     Attends religious service: Not on file    Active member of club or organization: Not on file    Attends meetings of clubs or organizations: Not on file    Relationship status: Not on file  . Intimate partner violence    Fear of current or ex partner: Not on file    Emotionally abused: Not on file    Physically abused: Not on file    Forced sexual activity: Not on file  Other Topics Concern  . Not on file  Social History Narrative   Divorced, twins age 79   Diet: moderate, water   Exercise: walks sporadically    Past Surgical History:  Procedure Laterality Date  . CESAREAN SECTION  1996  . CHOLECYSTECTOMY N/A 01/21/2019   Procedure: LAPAROSCOPIC CHOLECYSTECTOMY -No grams;  Surgeon: Jules Husbands, MD;  Location: [facility] ORS;  Service: General;  Laterality: N/A;  . Dental Implant  2013  . 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 Heart disease Father 44  . Atrial fibrillation Father   . Heart disease Paternal Aunt   . Heart disease Paternal Grandmother   . Lung cancer Paternal Grandmother   . Stroke Paternal Grandmother   . Lung cancer Paternal Grandfather   . Lung cancer Maternal Grandfather   . Kidney cancer Maternal Grandmother     Allergies  Allergen Reactions  . Zithromax [Azithromycin]   . Penicillins Rash    Did it involve swelling of the face/tongue/throat, SOB, or low BP? No Did it involve sudden or severe rash/hives, skin peeling, or any reaction on the inside of your mouth or nose? YES Did you need to seek medical attention at a hospital or doctor's office? No When did it last happen? If all above answers are "NO", may proceed with cephalosporin use.    No current outpatient medications on file prior to visit.   No current facility-administered medications on file prior to visit.     Temp (!) 97.2 F (36.2 C) (Tympanic)   Wt 275 lb (124.7 kg)   LMP 09/25/2018   BMI 43.07  kg/m    Objective:   Physical Exam  Constitutional: She is oriented to person, place, and time. She appears well-nourished.  Respiratory: Effort normal.  Neurological: She is alert and oriented to person, place, and time.  Skin: Rash noted.   Pruritic appearing rash to bilateral forearms and lower extremities, dorsal feet representational of allergic dermatitis.   Psychiatric: She has a normal mood and affect.           Assessment & Plan:

## 2019-03-31 NOTE — Assessment & Plan Note (Signed)
Pruritic rash x one week, worse with spread to extremities. Exam today consistent for allergic dermatitis. Unclear cause. Little improvement with OTC treatment. Given widespread nature will treat with prednisone course. Rx sent to pharmacy. She will update.

## 2019-03-31 NOTE — Patient Instructions (Signed)
Start prednisone for rash. Take 2 tablets for three days, then 1 tablet for three days.   You can switch to Zyrtec or Allegra to help with itching. Do not take benadryl with these medications.  It was a pleasure to see you today!

## 2019-03-31 NOTE — Telephone Encounter (Signed)
Yes, please add her on at 3:40 today. Thanks.

## 2019-04-03 ENCOUNTER — Ambulatory Visit: Payer: Federal, State, Local not specified - PPO | Admitting: Primary Care

## 2019-04-03 ENCOUNTER — Telehealth: Payer: Self-pay | Admitting: Primary Care

## 2019-04-03 NOTE — Telephone Encounter (Signed)
Best number [phone] Pt called wanted to know if she could try a steroid cream instead of an oral steroid.  She had not taking the oral steroid yet wants to try the cream  [facility] [place]

## 2019-04-03 NOTE — Telephone Encounter (Signed)
Given that her rash was so wide spread, it would take numerous tubes of cream to treat the rash effectively. This would not be very cost effective.  I understand if she wants to avoid the oral steroid. If she still has the rash and it's wide spread then she can take an antihistamine like Allegra or Zyrtec along with Pepcid. She can take both daily for 1-2 weeks or until rash is gone.

## 2019-04-04 NOTE — Telephone Encounter (Signed)
Spoken and notified patient of Kate Clark's comments. Patient verbalized understanding.  

## 2019-05-14 ENCOUNTER — Emergency Department: Payer: Federal, State, Local not specified - PPO

## 2019-05-14 ENCOUNTER — Other Ambulatory Visit: Payer: Self-pay

## 2019-05-14 ENCOUNTER — Emergency Department
Admission: EM | Admit: 2019-05-14 | Discharge: 2019-05-14 | Disposition: A | Discharge status: Home Health | Payer: Federal, State, Local not specified - PPO | Attending: Emergency Medicine | Admitting: Emergency Medicine

## 2019-05-14 ENCOUNTER — Encounter: Payer: Self-pay | Admitting: Emergency Medicine

## 2019-05-14 DIAGNOSIS — Z880 Allergy status to penicillin: Secondary | ICD-10-CM | POA: Diagnosis not present

## 2019-05-14 DIAGNOSIS — Z8249 Family history of ischemic heart disease and other diseases of the circulatory system: Secondary | ICD-10-CM | POA: Insufficient documentation

## 2019-05-14 DIAGNOSIS — R0789 Other chest pain: Secondary | ICD-10-CM | POA: Diagnosis not present

## 2019-05-14 DIAGNOSIS — Z87891 Personal history of nicotine dependence: Secondary | ICD-10-CM | POA: Insufficient documentation

## 2019-05-14 DIAGNOSIS — R079 Chest pain, unspecified: Secondary | ICD-10-CM | POA: Diagnosis not present

## 2019-05-14 LAB — BASIC METABOLIC PANEL
Anion gap: 10 (ref 5–15)
BUN: 9 mg/dL (ref 6–20)
CO2: 24 mmol/L (ref 22–32)
Calcium: 8.9 mg/dL (ref 8.9–10.3)
Chloride: 105 mmol/L (ref 98–111)
Creatinine, Ser: 0.68 mg/dL (ref 0.44–1.00)
GFR calc Af Amer: >60 mL/min (ref >60)
GFR calc non Af Amer: >60 mL/min (ref >60)
Glucose, Bld: 151 mg/dL — ABNORMAL HIGH (ref 70–99)
Potassium: 4 mmol/L (ref 3.5–5.1)
Sodium: 139 mmol/L (ref 135–145)

## 2019-05-14 LAB — CBC
HCT: 42.2 % (ref 36.0–46.0)
Hemoglobin: 14.2 g/dL (ref 12.0–15.0)
MCH: 30.3 pg (ref 26.0–34.0)
MCHC: 33.6 g/dL (ref 30.0–36.0)
MCV: 90.2 fL (ref 80.0–100.0)
Platelets: 318 10*3/uL (ref 150–400)
RBC: 4.68 MIL/uL (ref 3.87–5.11)
RDW: 13.8 % (ref 11.5–15.5)
WBC: 10.3 10*3/uL (ref 4.0–10.5)
nRBC: 0 % (ref 0.0–0.2)

## 2019-05-14 LAB — TROPONIN I (HIGH SENSITIVITY)
Troponin I (High Sensitivity): 4 ng/L (ref <18)
Troponin I (High Sensitivity): 3 ng/L (ref <18)

## 2019-05-14 LAB — BRAIN NATRIURETIC PEPTIDE: B Natriuretic Peptide: 31 pg/mL (ref 0.0–100.0)

## 2019-05-14 MED ORDER — DICYCLOMINE HCL 10 MG/5ML PO SOLN
10.0000 mg | Freq: Once | ORAL | Status: AC
  Administered 2019-05-14: 17:00:00 10 mg via ORAL
  Filled 2019-05-14: qty 5

## 2019-05-14 MED ORDER — ALUM & MAG HYDROXIDE-SIMETH 200-200-20 MG/5ML PO SUSP
30.0000 mL | Freq: Once | ORAL | Status: AC
  Administered 2019-05-14: 17:00:00 30 mL via ORAL
  Filled 2019-05-14: qty 30

## 2019-05-14 MED ORDER — LIDOCAINE VISCOUS HCL 2 % MT SOLN
15.0000 mL | Freq: Once | OROMUCOSAL | Status: AC
  Administered 2019-05-14: 15 mL via ORAL
  Filled 2019-05-14: qty 15

## 2019-05-14 NOTE — ED Triage Notes (Signed)
Pt arrived via POV with reports of chest pain for " a while" pt states today the chest pain did not go away and she had cold sweats. Pt states the pain was at the base of her throat, pt states the pain is stabbing and feels like indigestion, that starts in the middle of her chest radiating to the left jaw.  PT states she took some pepcid this morning without relief.   Pt denies shortness of breath, dizziness, or back pain.

## 2019-05-14 NOTE — ED Notes (Signed)
Pt st in "cold sweat this morning". Pt st mid CP that radiates to base of throat and left jaw to left ear.  MD Malinda at bedside. Pt st nothing makes the pain worse or better. Pt st pt woke up with CP today and persistent all throughout the day.Marland Kitchen

## 2019-05-14 NOTE — Discharge Instructions (Addendum)
Your heart blood work is normal.  EKG looks okay as well.  Just to be safe I want you to follow-up with a cardiologist.  Dr. Humphrey Rolls is on call.  Please give his call in office a call in the morning.  He should go see you rapidly.  Please return here if you get any worse or develop any shortness of breath persistent pain or any other problems.

## 2019-05-14 NOTE — ED Provider Notes (Signed)
[facility] Emergency Department Provider Note   ____________________________________________   First MD Initiated Contact with Patient 05/14/19 1621     (approximate)  I have reviewed the triage vital signs and the nursing notes.   HISTORY  Chief Complaint Chest Pain    HPI Lori Kaiser is a 58 y.o. female patient complains of intermittent chest pain for the last 5 months or so.  It comes and goes lasts possibly half an hour or longer.  Nothing seems to bring it on or make it better.  There is a sharp component in the lower chest and she gets a heaviness in the upper chest that sometimes radiates to the jaw or the left ear.  Again it is not related to exertion.  Not related to eating either.  Seemed to improve briefly when she had her gallbladder out but then came back a couple months later.  Is been going on now for at least a month.  It is not associated with shortness of breath.  Patient does not have a fever.  Pain is mild to moderate         Past Medical History:  Diagnosis Date  . allergies   . Allergy   . Anemia   . Anxiety   . Blood transfusion without reported diagnosis   . Chronic headaches   . Depression   . Eating disorder   . Fatigue   . GERD (gastroesophageal reflux disease)   . History of chicken pox   . Irritable bowel syndrome (IBS)   . Migraine   . Urine incontinence     Patient Active Problem List   Diagnosis Date Noted  . Rash and nonspecific skin eruption 03/31/2019  . Cholecystitis 01/20/2019  . Generalized anxiety disorder 03/01/2014  . Panic attacks 03/01/2014  . GERD (gastroesophageal reflux disease) 03/01/2014  . Allergic rhinitis 03/01/2014  . Fibroids 03/01/2014  . Stress incontinence 03/01/2014  . Family history of early CAD 03/01/2014  . Common migraine without aura 01/17/2013  . IBS (irritable bowel syndrome) 01/17/2013  . Major depressive disorder, recurrent ([place]) 01/17/2013    Past Surgical  History:  Procedure Laterality Date  . CESAREAN SECTION  1996  . CHOLECYSTECTOMY N/A 01/21/2019   Procedure: LAPAROSCOPIC CHOLECYSTECTOMY -No grams;  Surgeon: Jules Husbands, MD;  Location: [facility] ORS;  Service: General;  Laterality: N/A;  . Dental Implant  2013  . DILATION AND CURETTAGE OF UTERUS     x2 [address]    Prior to Admission medications   Medication Sig Start Date End Date Taking? Authorizing Provider  predniSONE (DELTASONE) 20 MG tablet Take 2 tablets for three days then 1 tablet for three days. 03/31/19   Pleas Koch, NP    Allergies Zithromax [azithromycin] and Penicillins  Family History  Problem Relation Age of Onset  . Heart disease Father 75  . Atrial fibrillation Father   . Heart disease Paternal Aunt   . Heart disease Paternal Grandmother   . Lung cancer Paternal Grandmother   . Stroke Paternal Grandmother   . Lung cancer Paternal Grandfather   . Lung cancer Maternal Grandfather   . Kidney cancer Maternal Grandmother     Social History Social History   Tobacco Use  . Smoking status: Former Smoker    Packs/day: 1.00    Years: 12.00    Pack years: 12.00    Types: Cigarettes  . Smokeless tobacco: Never Used  Substance Use Topics  . Alcohol  use: No  . Drug use: No    Review of Systems  Constitutional: No fever/chills Eyes: No visual changes. ENT: No sore throat. Cardiovascular: See HPI Respiratory: Denies shortness of breath. Gastrointestinal: No abdominal pain.  No nausea, no vomiting.  No diarrhea.  No constipation. Genitourinary: Negative for dysuria. Musculoskeletal: Negative for back pain. Skin: Negative for rash. Neurological: Negative for headaches, focal weakness   ____________________________________________   PHYSICAL EXAM:  VITAL SIGNS: ED Triage Vitals  Enc Vitals Group     BP 05/14/19 1317 135/78     Pulse Rate 05/14/19 1317 89     Resp 05/14/19 1317 20     Temp 05/14/19 1317 98.4 F (36.9 C)      Temp Source 05/14/19 1317 Oral     SpO2 05/14/19 1317 99 %     Weight 05/14/19 1313 259 lb (117.5 kg)     Height 05/14/19 1313 5' 6.5" (1.689 m)     Head Circumference --      Peak Flow --      Pain Score 05/14/19 1312 4     Pain Loc --      Pain Edu? --      Excl. in [place]? --     Constitutional: Alert and oriented. Well appearing and in no acute distress. Eyes: Conjunctivae are normal. Head: Atraumatic. Nose: No congestion/rhinnorhea. Mouth/Throat: Mucous membranes are moist.  Oropharynx non-erythematous. Neck: No stridor.  ardiovascular: Normal rate, regular rhythm. Grossly normal heart sounds.  Good peripheral circulation. Respiratory: Normal respiratory effort.  No retractions. Lungs CTAB. Gastrointestinal: Soft and nontender. No distention. No abdominal bruits.  Musculoskeletal: No lower extremity tenderness nor edema.  Neurologic:  Normal speech and language. No gross focal neurologic deficits are appreciated.  Skin:  Skin is warm, dry and intact. No rash noted.   ____________________________________________   LABS (all labs ordered are listed, but only abnormal results are displayed)  Labs Reviewed  BASIC METABOLIC PANEL - Abnormal; Notable for the following components:      Result Value   Glucose, Bld 151 (*)    All other components within normal limits  CBC  BRAIN NATRIURETIC PEPTIDE  TROPONIN I (HIGH SENSITIVITY)  TROPONIN I (HIGH SENSITIVITY)   ____________________________________________  EKG  EKG read and interpreted by me shows normal sinus rhythm rate of 94 normal axis there are flipped T in lead III several years ago that was flattening in lead 12/2013 that was no other acute changes I did not see any other EKGs besides one from 2015 ____________________________________________  RADIOLOGY  ED MD interpretation x-ray read by radiology reviewed by me was felt by radiology shows central venous congestion.  There is some haziness there.  Patient's lungs are  clear and the BNP is negative however.  There are no old chest x-rays in the computer to compare to.  Patient has no edema no JVD and her O2 sats are 99.  I do not believe she has central venous congestion.  Official radiology report(s): Dg Chest 2 View  Result Date: 05/14/2019 CLINICAL DATA:  Pt arrived via POV with reports of chest pain for " a while" pt states today the chest pain did not go away and she had cold sweats. Pt states the pain was at the base of her throat, pt states the pain is stabbing EXAM: CHEST - 2 VIEW COMPARISON:  None. FINDINGS: Normal cardiac silhouette. Mild central venous congestion. No effusion, infiltrate or pneumothorax. No acute osseous abnormality. IMPRESSION: Central venous congestion.  No infiltrate or effusion. Electronically Signed   By: Suzy Bouchard M.D.   On: 05/14/2019 14:51    ____________________________________________   PROCEDURES  Procedure(s) performed (including Critical Care):  Procedures   ____________________________________________   INITIAL IMPRESSION / ASSESSMENT AND PLAN / ED COURSE  Patient's history is not consistent with cardiac disease.  Her troponins are negative her BNP is negative.  I will have her follow-up with cardiology just in case.  We will have her return if she is worse              ____________________________________________   FINAL CLINICAL IMPRESSION(S) / ED DIAGNOSES  Final diagnoses:  Nonspecific chest pain     ED Discharge Orders    None       Note:  This document was prepared using Dragon voice recognition software and may include unintentional dictation errors.    Nena Polio, MD 05/14/19 [phone]

## 2019-05-14 NOTE — ED Notes (Signed)
MD Malinda at bedside. 

## 2019-05-18 DIAGNOSIS — I10 Essential (primary) hypertension: Secondary | ICD-10-CM | POA: Diagnosis not present

## 2019-05-18 DIAGNOSIS — I2510 Atherosclerotic heart disease of native coronary artery without angina pectoris: Secondary | ICD-10-CM | POA: Diagnosis not present

## 2019-05-18 DIAGNOSIS — R0602 Shortness of breath: Secondary | ICD-10-CM | POA: Diagnosis not present

## 2019-05-18 DIAGNOSIS — R079 Chest pain, unspecified: Secondary | ICD-10-CM | POA: Diagnosis not present

## 2019-09-19 DIAGNOSIS — K219 Gastro-esophageal reflux disease without esophagitis: Secondary | ICD-10-CM | POA: Diagnosis not present

## 2019-09-19 DIAGNOSIS — Z01818 Encounter for other preprocedural examination: Secondary | ICD-10-CM | POA: Diagnosis not present

## 2019-09-19 DIAGNOSIS — R1310 Dysphagia, unspecified: Secondary | ICD-10-CM | POA: Diagnosis not present

## 2019-12-29 ENCOUNTER — Ambulatory Visit: Payer: Federal, State, Local not specified - PPO | Attending: Internal Medicine

## 2019-12-29 DIAGNOSIS — Z23 Encounter for immunization: Secondary | ICD-10-CM

## 2019-12-29 NOTE — Progress Notes (Signed)
   Covid-19 Vaccination Clinic  Name:  Lori Kaiser    MRN: [id number] DOB: 12-Dec-1960  12/29/2019  [name] was observed post Covid-19 immunization for 15 minutes without incident. She was provided with Vaccine Information Sheet and instruction to access the V-Safe system.   [name] was instructed to call 911 with any severe reactions post vaccine: Marland Kitchen Difficulty breathing  . Swelling of face and throat  . A fast heartbeat  . A bad rash all over body  . Dizziness and weakness   Immunizations Administered    Name Date Dose VIS Date Route   Pfizer COVID-19 Vaccine 12/29/2019 12:18 PM 0.3 mL 09/22/2019 Intramuscular   Manufacturer: [place]   Lot: [id number]   [place]: [id number]

## 2020-01-18 ENCOUNTER — Other Ambulatory Visit: Payer: Self-pay

## 2020-01-18 ENCOUNTER — Ambulatory Visit: Payer: Federal, State, Local not specified - PPO | Admitting: Primary Care

## 2020-01-18 VITALS — BP 124/84 | HR 82 | Temp 97.3°F | Ht 66.5 in | Wt 275.2 lb

## 2020-01-18 DIAGNOSIS — K581 Irritable bowel syndrome with constipation: Secondary | ICD-10-CM

## 2020-01-18 DIAGNOSIS — R1084 Generalized abdominal pain: Secondary | ICD-10-CM | POA: Insufficient documentation

## 2020-01-18 DIAGNOSIS — R197 Diarrhea, unspecified: Secondary | ICD-10-CM | POA: Insufficient documentation

## 2020-01-18 DIAGNOSIS — K219 Gastro-esophageal reflux disease without esophagitis: Secondary | ICD-10-CM | POA: Diagnosis not present

## 2020-01-18 HISTORY — DX: Generalized abdominal pain: R10.84

## 2020-01-18 LAB — COMPREHENSIVE METABOLIC PANEL
ALT: 17 U/L (ref 0–35)
AST: 21 U/L (ref 0–37)
Albumin: 3.9 g/dL (ref 3.5–5.2)
Alkaline Phosphatase: 117 U/L (ref 39–117)
BUN: 5 mg/dL — ABNORMAL LOW (ref 6–23)
CO2: 25 mEq/L (ref 19–32)
Calcium: 8.8 mg/dL (ref 8.4–10.5)
Chloride: 102 mEq/L (ref 96–112)
Creatinine, Ser: 0.77 mg/dL (ref 0.40–1.20)
GFR: 77.26 mL/min (ref >60.00)
Glucose, Bld: 161 mg/dL — ABNORMAL HIGH (ref 70–99)
Potassium: 3.7 mEq/L (ref 3.5–5.1)
Sodium: 136 mEq/L (ref 135–145)
Total Bilirubin: 1.4 mg/dL — ABNORMAL HIGH (ref 0.2–1.2)
Total Protein: 6.5 g/dL (ref 6.0–8.3)

## 2020-01-18 LAB — CBC WITH DIFFERENTIAL/PLATELET
Basophils Absolute: 0.1 10*3/uL (ref 0.0–0.1)
Basophils Relative: 0.8 % (ref 0.0–3.0)
Eosinophils Absolute: 0.1 10*3/uL (ref 0.0–0.7)
Eosinophils Relative: 1.7 % (ref 0.0–5.0)
HCT: 40.1 % (ref 36.0–46.0)
Hemoglobin: 13.6 g/dL (ref 12.0–15.0)
Lymphocytes Relative: 23.8 % (ref 12.0–46.0)
Lymphs Abs: 2 10*3/uL (ref 0.7–4.0)
MCHC: 33.8 g/dL (ref 30.0–36.0)
MCV: 88 fl (ref 78.0–100.0)
Monocytes Absolute: 0.3 10*3/uL (ref 0.1–1.0)
Monocytes Relative: 4.2 % (ref 3.0–12.0)
Neutro Abs: 5.8 10*3/uL (ref 1.4–7.7)
Neutrophils Relative %: 69.5 % (ref 43.0–77.0)
Platelets: 259 10*3/uL (ref 150.0–400.0)
RBC: 4.56 Mil/uL (ref 3.87–5.11)
RDW: 14.2 % (ref 11.5–15.5)
WBC: 8.3 10*3/uL (ref 4.0–10.5)

## 2020-01-18 LAB — LIPASE: Lipase: 13 U/L (ref 11.0–59.0)

## 2020-01-18 NOTE — Assessment & Plan Note (Signed)
HPI and exam consistent more so for IBS.  Will rule out pancreatic involvement, abdominal infection, gall duct stones.  Check stool studies. Labs for CBC, Lipase, CMP pending.  She is stable for outpatient treatment.

## 2020-01-18 NOTE — Patient Instructions (Addendum)
Stop by the lab prior to leaving today. I will notify you of your results once received.   You will be contacted regarding your ultrasound.  Please let us know if you have not been contacted within two weeks.   Return the stool specimen as soon as possible.  Continue to work on a bland diet. Stay hydrated with water.   It was a pleasure to see you today!

## 2020-01-18 NOTE — Addendum Note (Signed)
Addended by: Cloyd Stagers on: 01/18/2020 12:26 PM   Modules accepted: Orders

## 2020-01-18 NOTE — Progress Notes (Signed)
Subjective:    Patient ID: Lori Kaiser, female    DOB: 1961/01/18, 59 y.o.   MRN: [id number]  HPI  This visit occurred during the SARS-CoV-2 public health emergency.  Safety protocols were in place, including screening questions prior to the visit, additional usage of staff PPE, and extensive cleaning of exam room while observing appropriate contact time as indicated for disinfecting solutions.   Ms. Lori Kaiser is a 59 year old female with a history of cholecystectomy, IBS, GERD, stress incontinence who presents today with a chief complaint of abdominal pain.  Her pain begins to the epigastric region, radiates to the RUQ, and then will notice pain all over her abdomen. She describes her pain as bloating and cramping. Her pain began about 10 days ago.  She initially had nausea, this has improved. No vomiting. She's had diarrhea over the last 10 days which "comes and goes", last episode of diarrhea was this morning. She will have 5-7 episodes of diarrhea when it flares. Diarrhea is an orange color.   She denies fevers, bloody stools, urinary symptoms. She feels hungry but doesn't feel like eating much. She's mostly been eating a bland diet, staying hydrated. She's been compliant to her Pepcid AC once to twice daily, also with daily Tum's consumption due to daily heartburn.   She saw GI in December 2020 who prescribed omeprazole 40 mg, she never took the prescription. She was to be scheduled for an endoscopy and colonoscopy at the time, never scheduled. She's never had a colonoscopy, no known history of diverticulitis. No recent antibiotic use.  Review of Systems  Constitutional: Negative for fever.  Gastrointestinal: Positive for abdominal pain and diarrhea. Negative for blood in stool, constipation, nausea and vomiting.       Esophageal reflux       Past Medical History:  Diagnosis Date  . allergies   . Allergy   . Anemia   . Anxiety   . Blood transfusion without reported diagnosis   .  Chronic headaches   . Depression   . Eating disorder   . Fatigue   . GERD (gastroesophageal reflux disease)   . History of chicken pox   . Irritable bowel syndrome (IBS)   . Migraine   . Urine incontinence      Social History   Socioeconomic History  . Marital status: Divorced    Spouse name: Not on file  . Number of children: 2  . Years of education: Not on file  . Highest education level: Not on file  Occupational History  . Occupation: Doctor, hospital: [employer]  Tobacco Use  . Smoking status: Former Smoker    Packs/day: 1.00    Years: 12.00    Pack years: 12.00    Types: Cigarettes  . Smokeless tobacco: Never Used  Substance and Sexual Activity  . Alcohol use: No  . Drug use: No  . Sexual activity: Never  Other Topics Concern  . Not on file  Social History Narrative   Divorced, twins age 10   Diet: moderate, water   Exercise: walks sporadically   Social Determinants of Health   Financial Resource Strain:   . Difficulty of Paying Living Expenses:   Food Insecurity:   . Worried About Charity fundraiser in the Last Year:   . Arboriculturist in the Last Year:   Transportation Needs:   . Film/video editor (Medical):   Marland Kitchen Lack of Transportation (Non-Medical):  Physical Activity:   . Days of Exercise per Week:   . Minutes of Exercise per Session:   Stress:   . Feeling of Stress :   Social Connections:   . Frequency of Communication with Friends and Family:   . Frequency of Social Gatherings with Friends and Family:   . Attends Religious Services:   . Active Member of Clubs or Organizations:   . Attends Archivist Meetings:   Marland Kitchen Marital Status:   Intimate Partner Violence:   . Fear of Current or Ex-Partner:   . Emotionally Abused:   Marland Kitchen Physically Abused:   . Sexually Abused:     Past Surgical History:  Procedure Laterality Date  . CESAREAN SECTION  1996  . CHOLECYSTECTOMY N/A 01/21/2019   Procedure: LAPAROSCOPIC  CHOLECYSTECTOMY -No grams;  Surgeon: Jules Husbands, MD;  Location: [facility] ORS;  Service: General;  Laterality: N/A;  . Dental Implant  2013  . 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 Heart disease Father 49  . Atrial fibrillation Father   . Heart disease Paternal Aunt   . Heart disease Paternal Grandmother   . Lung cancer Paternal Grandmother   . Stroke Paternal Grandmother   . Lung cancer Paternal Grandfather   . Lung cancer Maternal Grandfather   . Kidney cancer Maternal Grandmother     Allergies  Allergen Reactions  . Zithromax [Azithromycin]   . Penicillins Rash    Did it involve swelling of the face/tongue/throat, SOB, or low BP? No Did it involve sudden or severe rash/hives, skin peeling, or any reaction on the inside of your mouth or nose? YES Did you need to seek medical attention at a hospital or doctor's office? No When did it last happen? If all above answers are "NO", may proceed with cephalosporin use.    Current Outpatient Medications on File Prior to Visit  Medication Sig Dispense Refill  . famotidine (PEPCID) 20 MG tablet Take 20 mg by mouth 2 (two) times daily.     No current facility-administered medications on file prior to visit.    BP 124/84   Pulse 82   Temp (!) 97.3 F (36.3 C) (Temporal)   Ht 5' 6.5" (1.689 m)   Wt 275 lb 4 oz (124.9 kg)   LMP 09/25/2018   SpO2 97%   BMI 43.76 kg/m    Objective:   Physical Exam  Constitutional: She appears well-nourished.  Cardiovascular: Normal rate.  Respiratory: Effort normal.  GI: Soft. Bowel sounds are normal. There is no abdominal tenderness.    Skin: Skin is warm and dry.           Assessment & Plan:

## 2020-01-18 NOTE — Assessment & Plan Note (Signed)
Uncontrolled on famotidine 20 mg BID and daily Tums. Encouraged patient to move forward with omeprazole as prescribed. Referral placed for new GI provider per patient request.

## 2020-01-18 NOTE — Assessment & Plan Note (Signed)
Acute and intermittent for the last 10 days, also with odd color. No recent antibiotic use.  Check stool studies, CBC, CMP. Continue bland diet. Stay hydrated with water. Abdominal US pending.

## 2020-01-19 ENCOUNTER — Other Ambulatory Visit (INDEPENDENT_AMBULATORY_CARE_PROVIDER_SITE_OTHER): Payer: Federal, State, Local not specified - PPO

## 2020-01-19 ENCOUNTER — Ambulatory Visit
Admission: RE | Admit: 2020-01-19 | Discharge: 2020-01-19 | Disposition: A | Discharge status: Home / Self Care | Payer: Federal, State, Local not specified - PPO | Source: Ambulatory Visit | Attending: Primary Care | Admitting: Primary Care

## 2020-01-19 DIAGNOSIS — R739 Hyperglycemia, unspecified: Secondary | ICD-10-CM | POA: Diagnosis not present

## 2020-01-19 DIAGNOSIS — R197 Diarrhea, unspecified: Secondary | ICD-10-CM

## 2020-01-19 DIAGNOSIS — R1084 Generalized abdominal pain: Secondary | ICD-10-CM

## 2020-01-19 DIAGNOSIS — R1011 Right upper quadrant pain: Secondary | ICD-10-CM | POA: Diagnosis not present

## 2020-01-19 LAB — GASTROINTESTINAL PATHOGEN PANEL PCR
C. difficile Tox A/B, PCR: NOT DETECTED
Campylobacter, PCR: NOT DETECTED
Cryptosporidium, PCR: NOT DETECTED
E coli (ETEC) LT/ST PCR: NOT DETECTED
E coli (STEC) stx1/stx2, PCR: NOT DETECTED
E coli 0157, PCR: NOT DETECTED
Giardia lamblia, PCR: NOT DETECTED
Norovirus, PCR: NOT DETECTED
Rotavirus A, PCR: NOT DETECTED
Salmonella, PCR: NOT DETECTED
Shigella, PCR: NOT DETECTED

## 2020-01-19 LAB — HEMOGLOBIN A1C: Hgb A1c MFr Bld: 5.6 % (ref 4.6–6.5)

## 2020-01-24 ENCOUNTER — Ambulatory Visit: Payer: Federal, State, Local not specified - PPO | Attending: Internal Medicine

## 2020-01-24 DIAGNOSIS — Z23 Encounter for immunization: Secondary | ICD-10-CM

## 2020-01-24 NOTE — Progress Notes (Signed)
   Covid-19 Vaccination Clinic  Name:  Lori Kaiser    MRN: [id number] DOB: 1961-03-22  01/24/2020  Lori Kaiser was observed post Covid-19 immunization for 15 minutes without incident. She was provided with Vaccine Information Sheet and instruction to access the V-Safe system.   Lori Kaiser was instructed to call 911 with any severe reactions post vaccine: Marland Kitchen Difficulty breathing  . Swelling of face and throat  . A fast heartbeat  . A bad rash all over body  . Dizziness and weakness   Immunizations Administered    Name Date Dose VIS Date Route   Pfizer COVID-19 Vaccine 01/24/2020  8:23 AM 0.3 mL 09/22/2019 Intramuscular   Manufacturer: [place]   Lot: [id number]   [place]: [id number]

## 2020-11-13 IMAGING — US ULTRASOUND ABDOMEN LIMITED
1 series · 13 of 25 positions shown · non-contrast
Comparison: None.

CLINICAL DATA: Recent laparoscopic cholecystectomy with persistent
pain

EXAM:
ULTRASOUND ABDOMEN LIMITED RIGHT UPPER QUADRANT

[Series 1: ultrasound abdomen limited · 46 acquisitions, 13 frames shown]
[im 1/46]
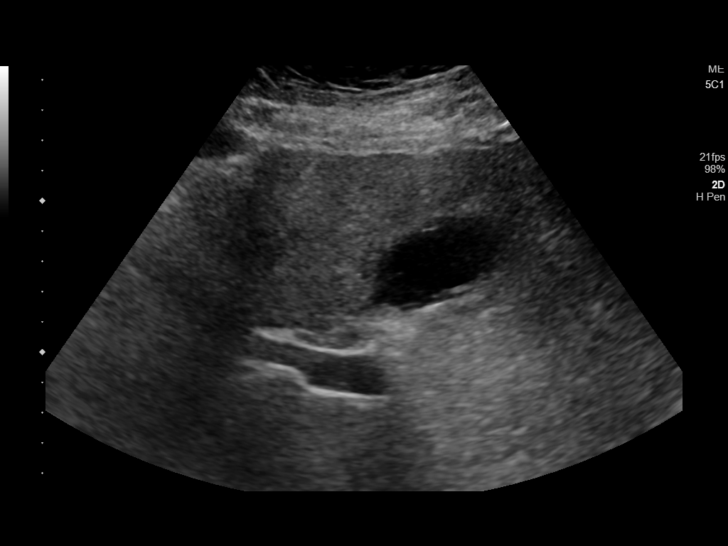
[im 4/46]
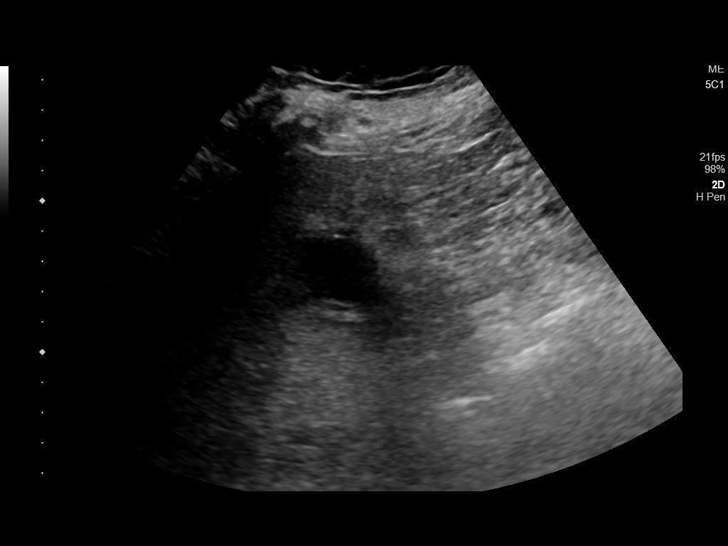
[im 8/46]
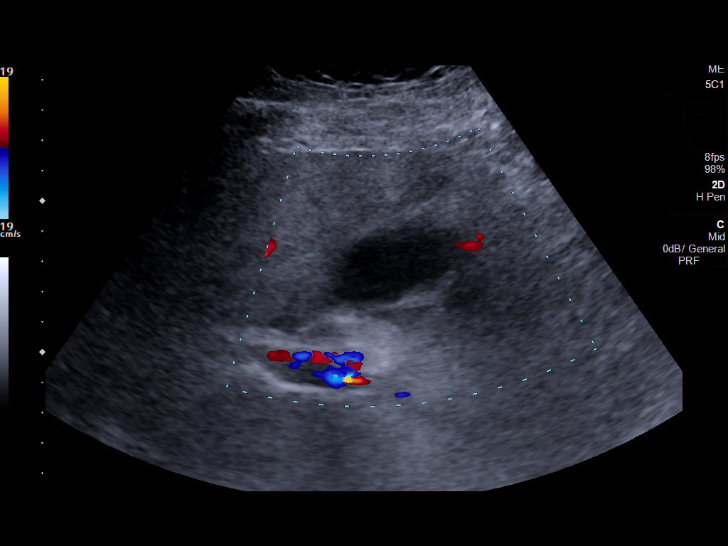
[im 12/46]
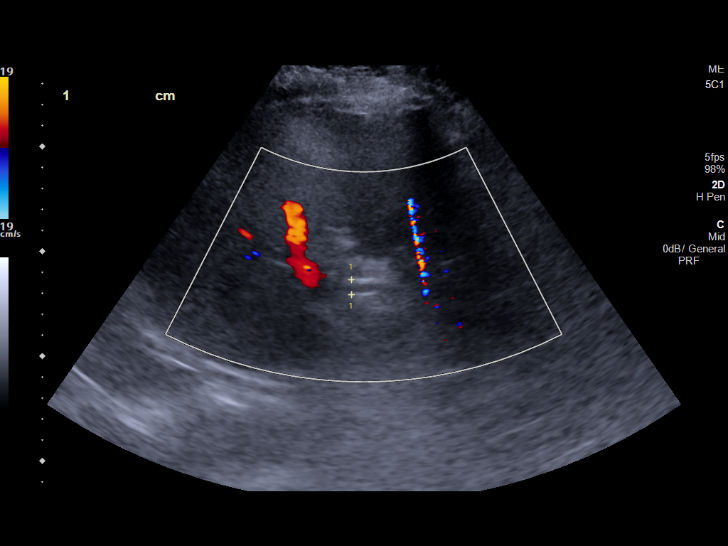
[im 16/46]
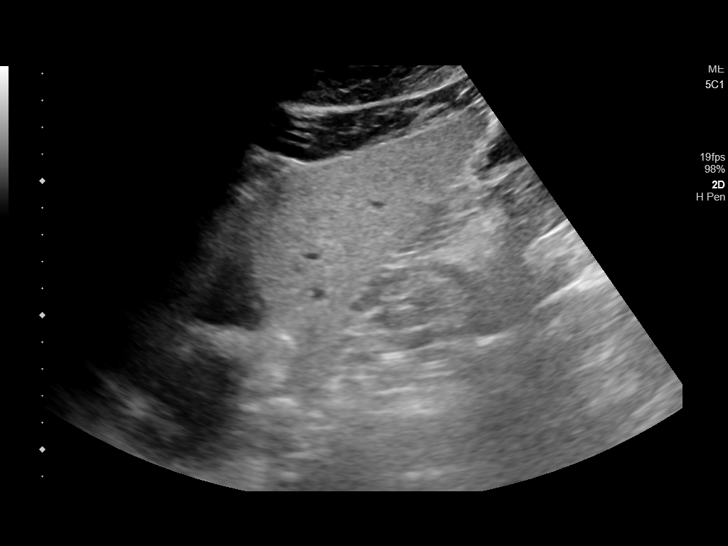
[im 19/46]
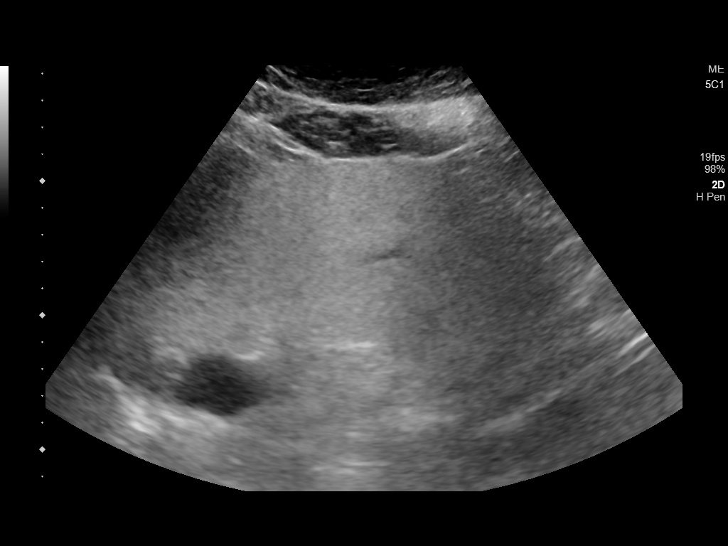
[im 23/46]
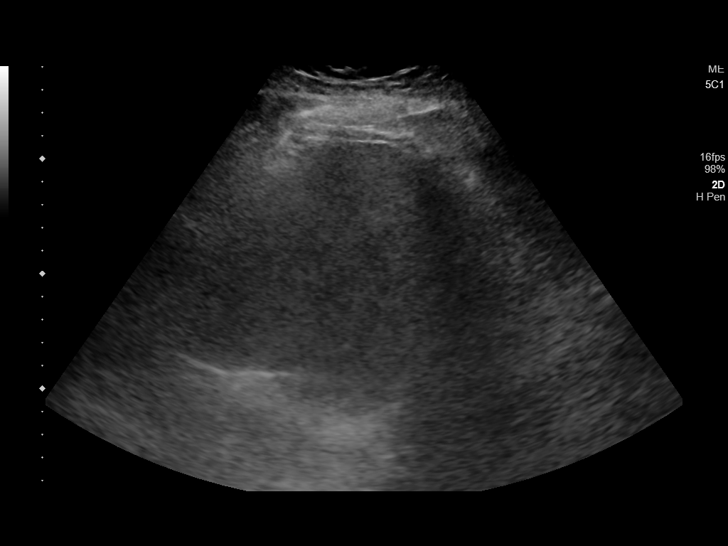
[im 27/46]
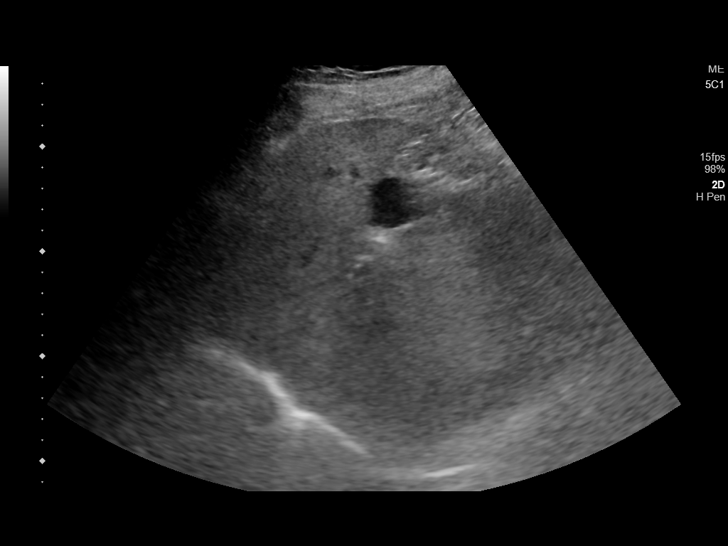
[im 31/46]
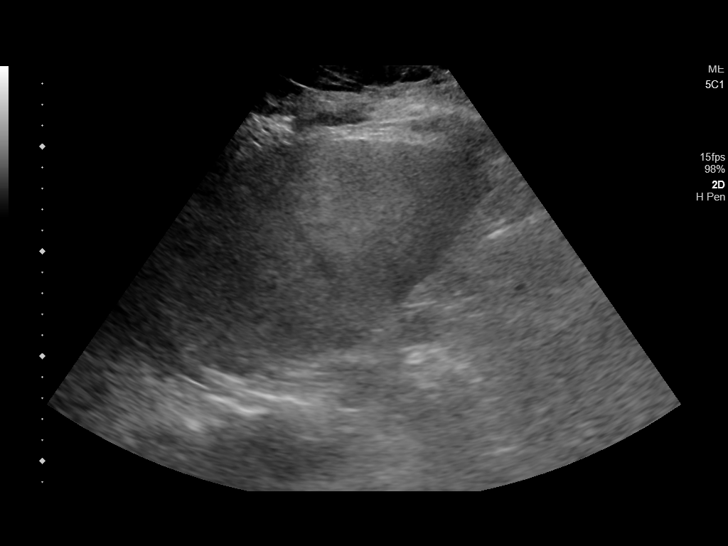
[im 34/46]
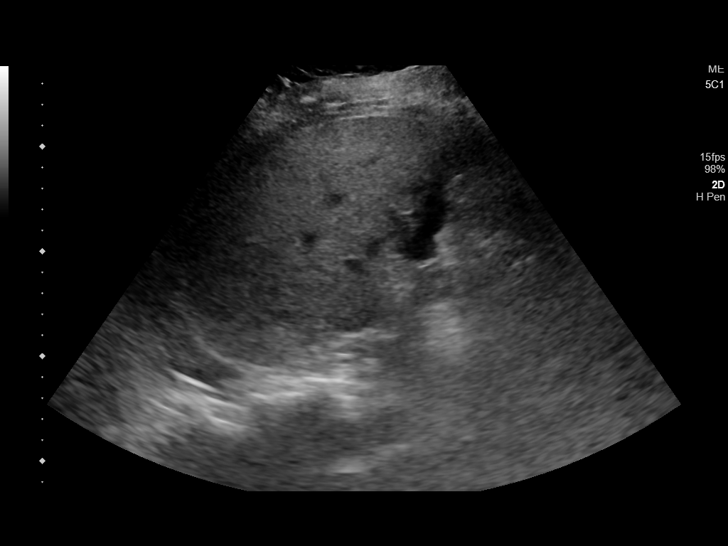
[im 38/46]
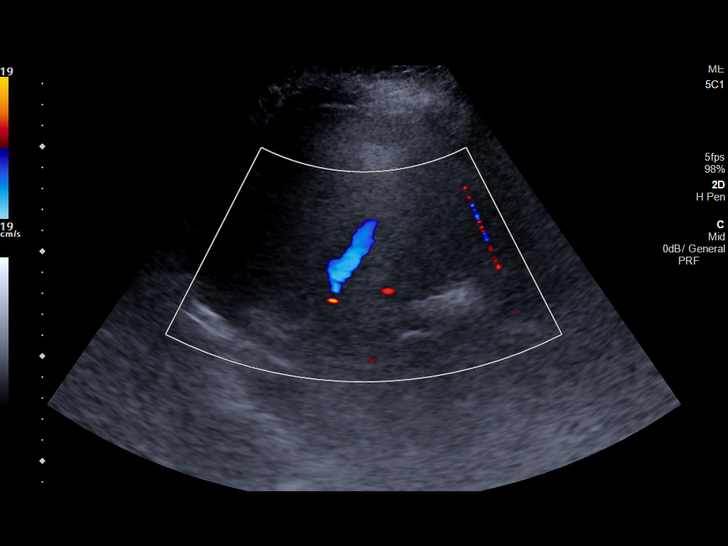
[im 42/46]
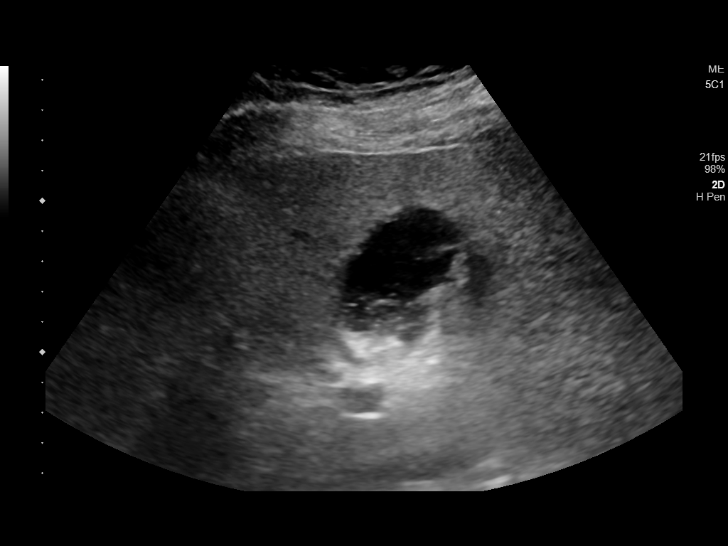
[im 46/46]
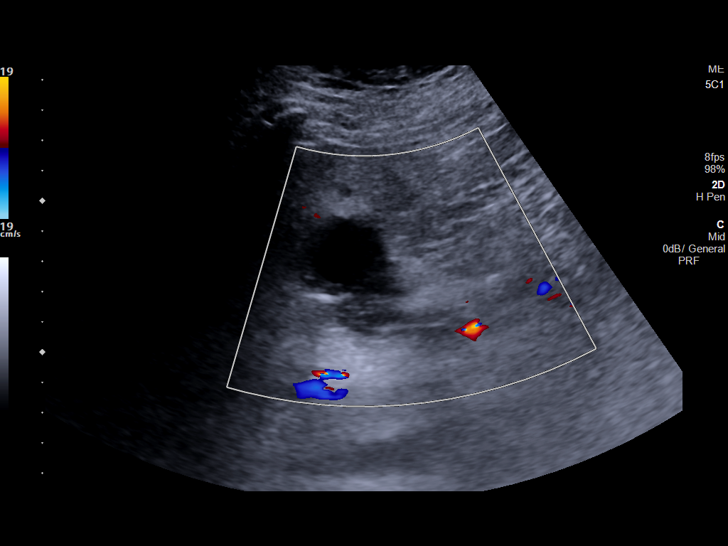

[13 of 25 positions shown; findings below may reference images not displayed]

FINDINGS: Gallbladder:

Surgically absent. There is a fluid collection in the gallbladder
fossa region measuring 4.9 x 3.0 x 2.5 cm. This fluid collection has
a marginally thickened wall and contains scattered internal echoes.
This lesion appears essentially anechoic.

Common bile duct:

Diameter: 7 mm, upper normal.  No biliary duct mass or calculus.

Liver:

No focal lesion identified. Liver echogenicity is overall increased.
Portal vein is patent on color Doppler imaging with normal direction
of blood flow towards the liver.
IMPRESSION: 1. Gallbladder absent. Fluid collection in the gallbladder fossa
region with slightly thickened wall, measuring 4.9 x 3.0 x 2.5 cm.
Suspect liquefying hematomas most likely etiology. Infection in this
area cannot be excluded by ultrasound given the internal echoes
within this structure. No air is seen by ultrasound within this
fluid collection.

2. Increase in liver echogenicity, a finding most likely indicative
of a degree of hepatic steatosis. While no focal liver lesions are
evident on this study, it must be cautioned that the sensitivity of
ultrasound for detection of focal liver lesions is diminished in
this circumstance.

These results will be called to the ordering clinician or
representative by the Radiologist Assistant, and communication
documented in the PACS or zVision Dashboard.

## 2020-12-23 IMAGING — US ULTRASOUND ABDOMEN LIMITED
1 series · 14 of 25 positions shown · non-contrast
Comparison: None.

CLINICAL DATA: Acute right upper quadrant abdominal pain.

EXAM:
ULTRASOUND ABDOMEN LIMITED RIGHT UPPER QUADRANT

[Series 1: ultrasound abdomen limited · 0.30mm/px · 14 of 39 slices shown]
[im 1/39]
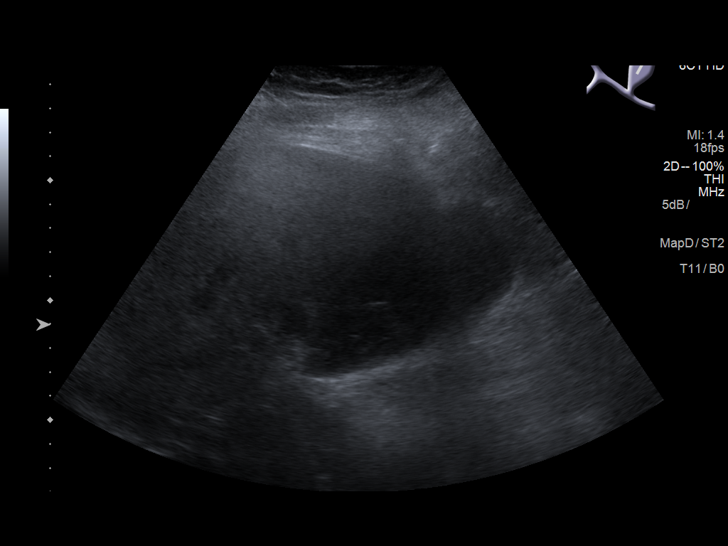
[im 4/39]
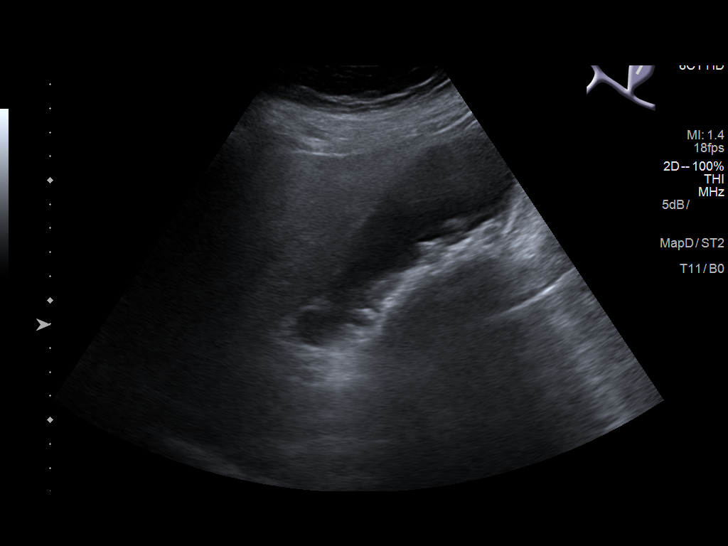
[im 7/39]
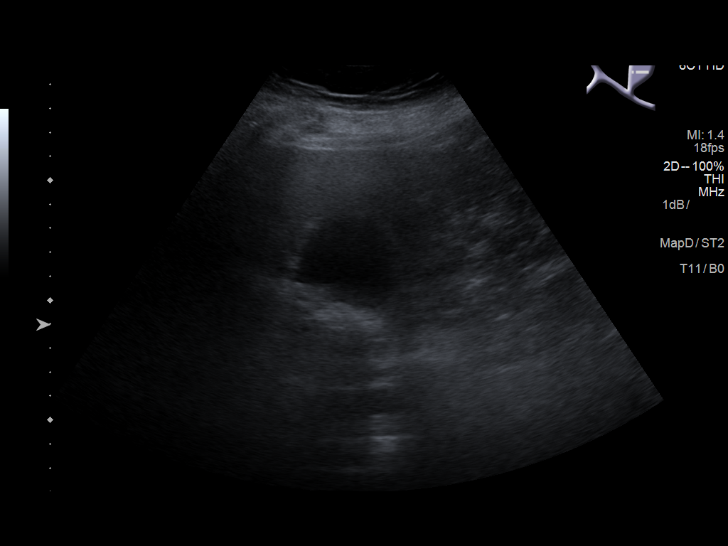
[im 10/39]
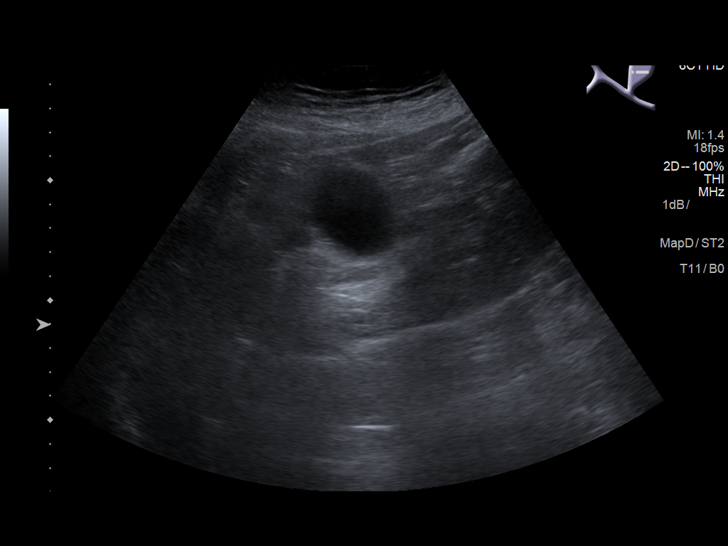
[im 13/39]
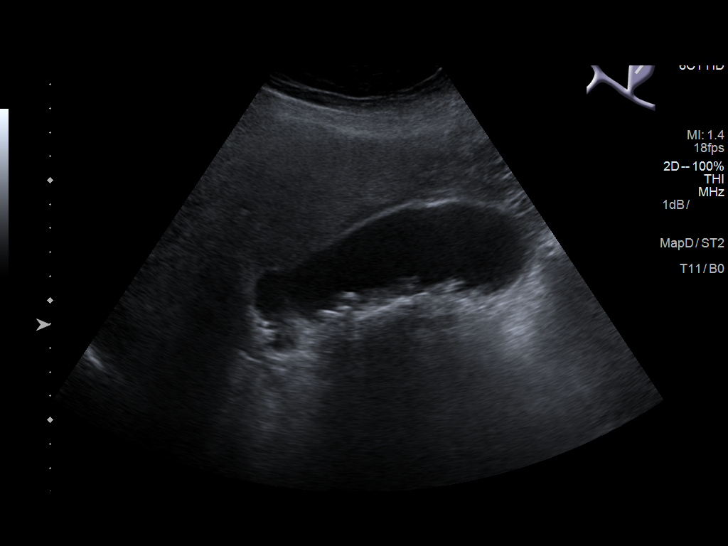
[im 15/39]
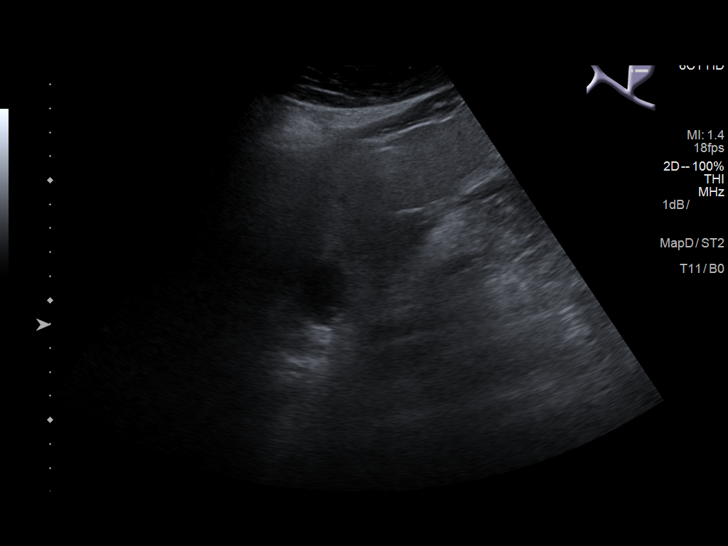
[im 18/39]
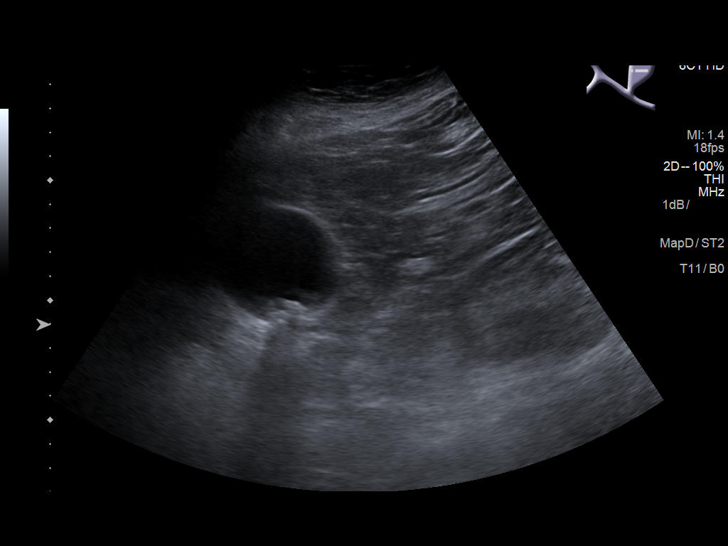
[im 21/39]
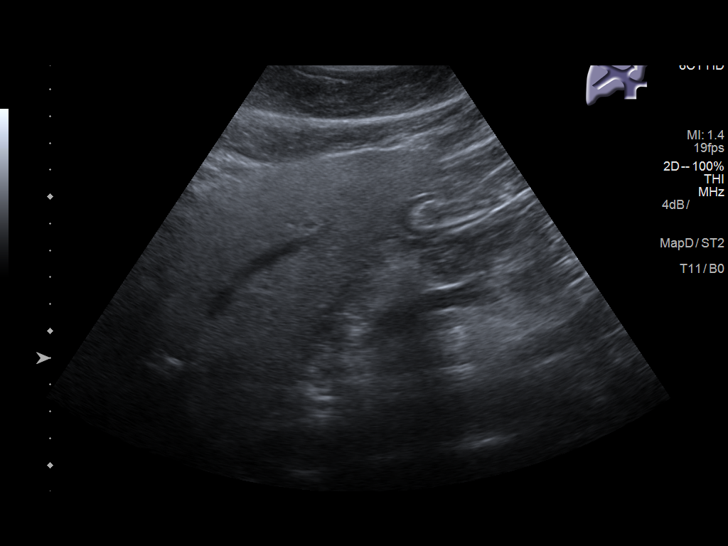
[im 24/39]
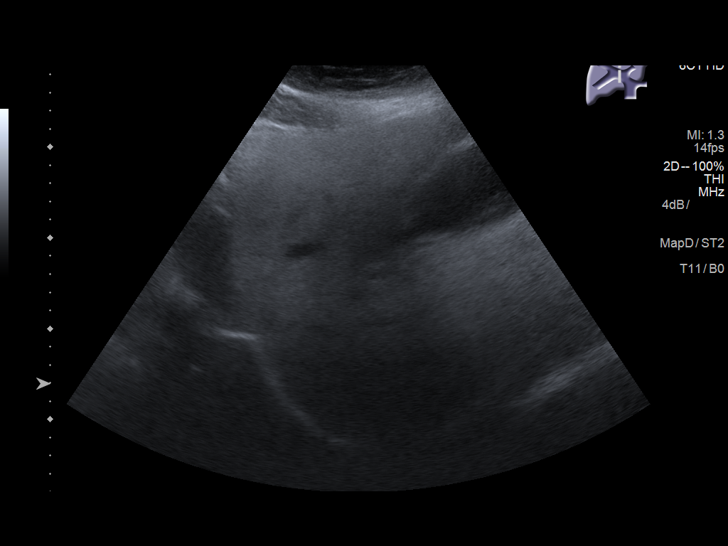
[im 26/39]
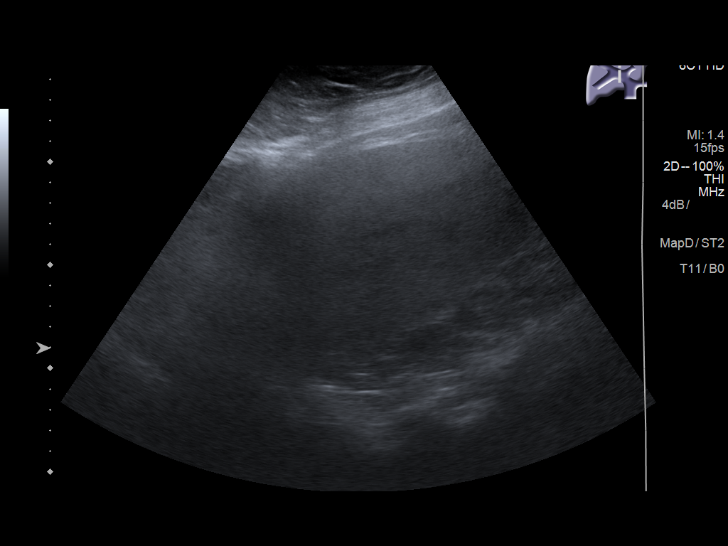
[im 29/39]
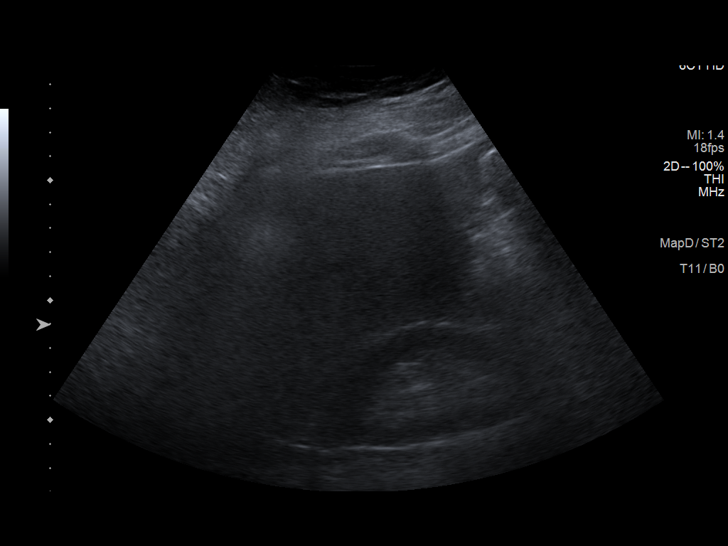
[im 32/39]
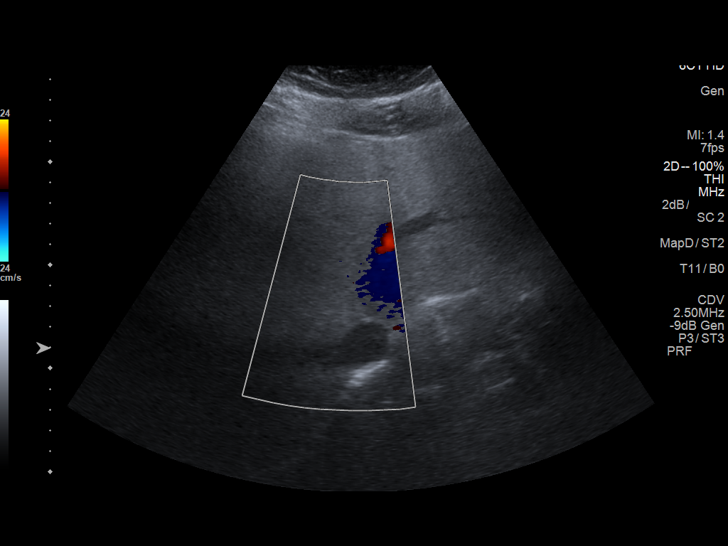
[im 35/39]
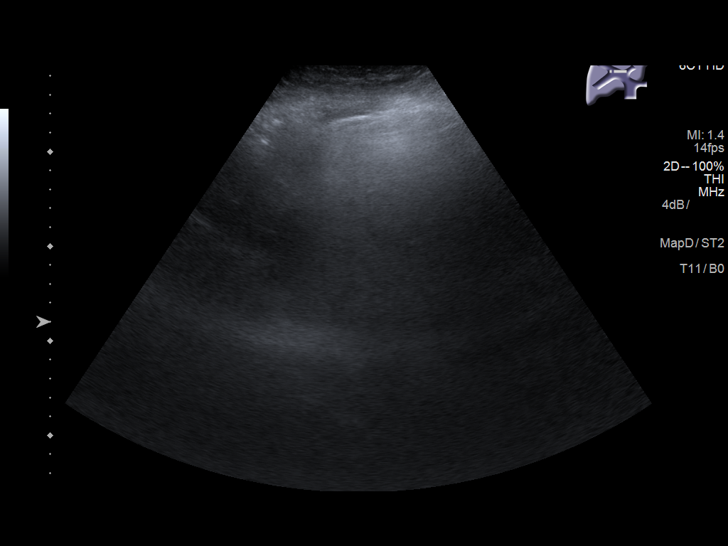
[im 39/39]
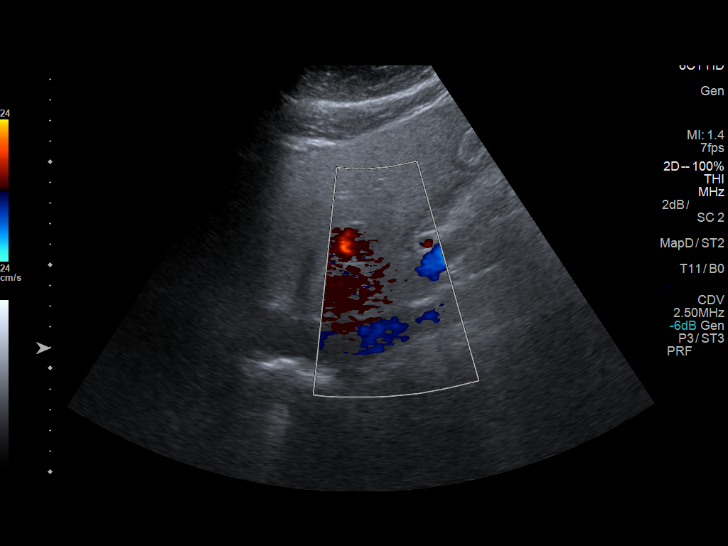

[14 of 25 positions shown; findings below may reference images not displayed]

FINDINGS: Gallbladder:

Cholelithiasis is noted with mild gallbladder wall thickening
measuring 4 mm. No pericholecystic fluid is noted. Positive
sonographic Murphy's sign is noted.

Common bile duct:

Diameter: 5 mm which is within normal limits.

Liver:

No focal lesion identified. Increased echogenicity of hepatic
parenchyma is noted suggesting hepatic steatosis. Portal vein is
patent on color Doppler imaging with normal direction of blood flow
towards the liver.
IMPRESSION: Cholelithiasis with mild gallbladder wall thickening and positive
sonographic Murphy's sign concerning for possible cholecystitis.
Clinical correlation is recommended.

Probable hepatic steatosis.

## 2021-01-29 ENCOUNTER — Ambulatory Visit: Payer: Federal, State, Local not specified - PPO | Admitting: Primary Care

## 2021-11-02 IMAGING — US US ABDOMEN COMPLETE
1 series · 13 of 25 positions shown · non-contrast
Comparison: Abdominal ultrasound 01/30/2019

CLINICAL DATA: Abdominal pain and diarrhea. History of
cholecystectomy. Right upper quadrant pain radiating throughout the
abdomen.

EXAM:
ABDOMEN ULTRASOUND COMPLETE

[Series 1: us abdomen complete · 0.30mm/px · 13 of 75 slices shown]
[im 1/75]
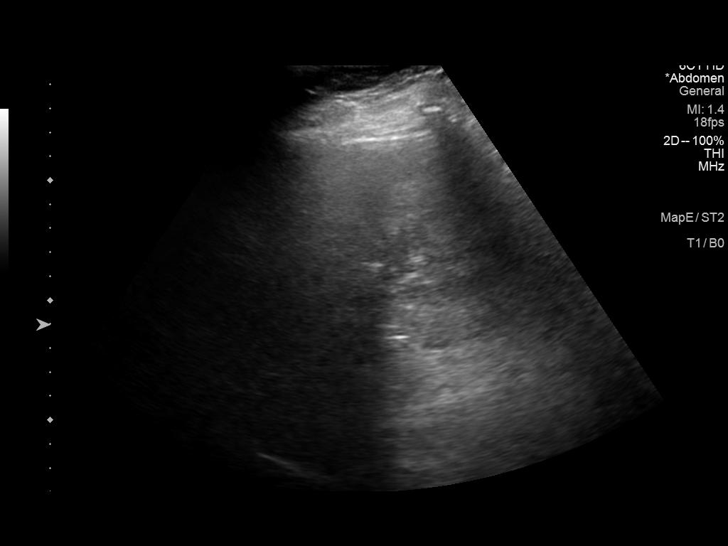
[im 7/75]
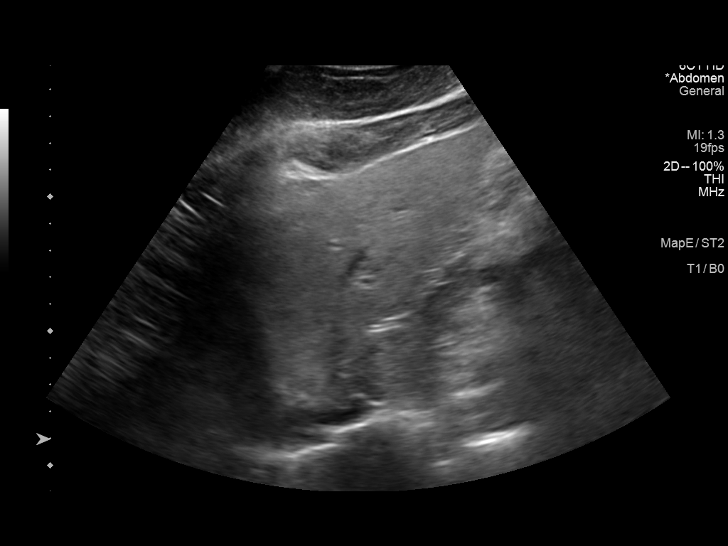
[im 13/75]
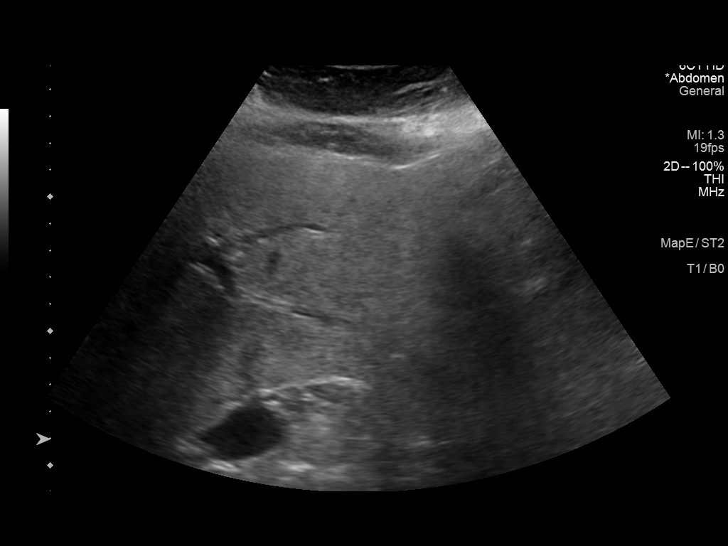
[im 19/75]
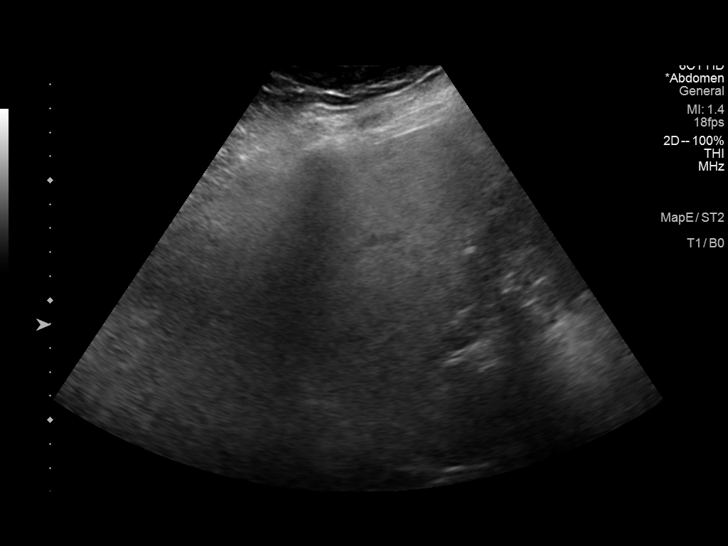
[im 25/75]
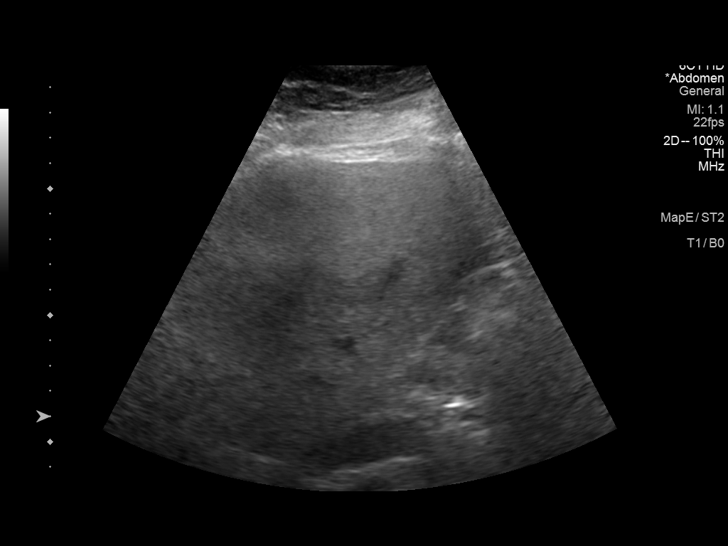
[im 31/75]
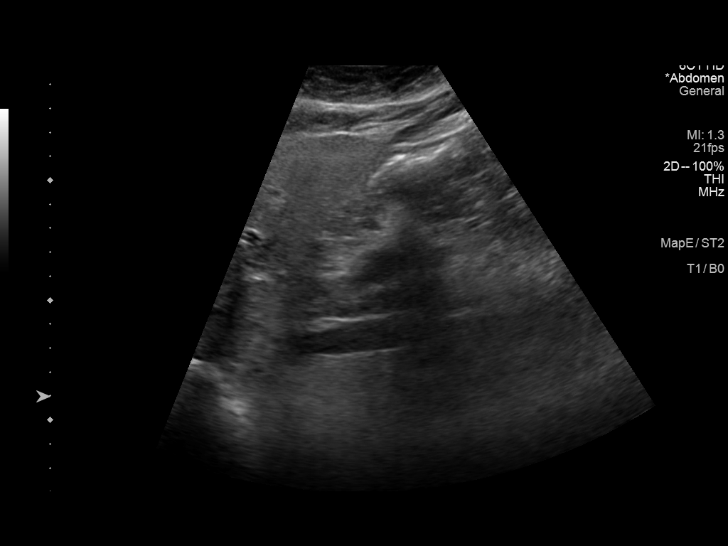
[im 38/75]
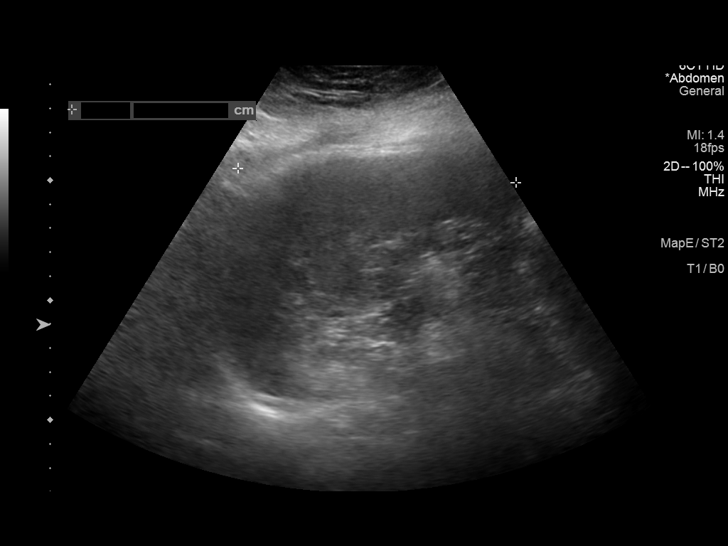
[im 44/75]
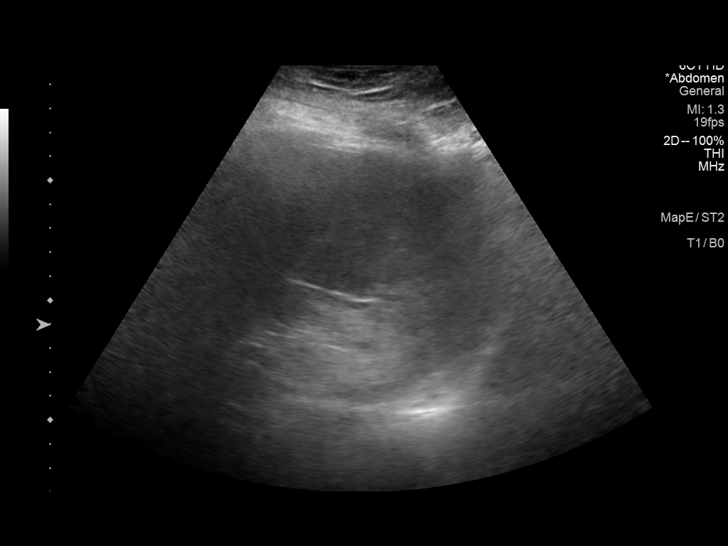
[im 50/75]
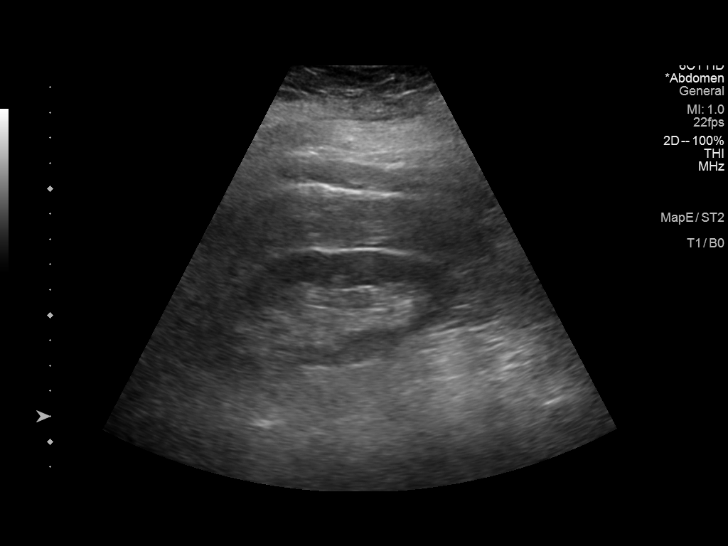
[im 56/75]
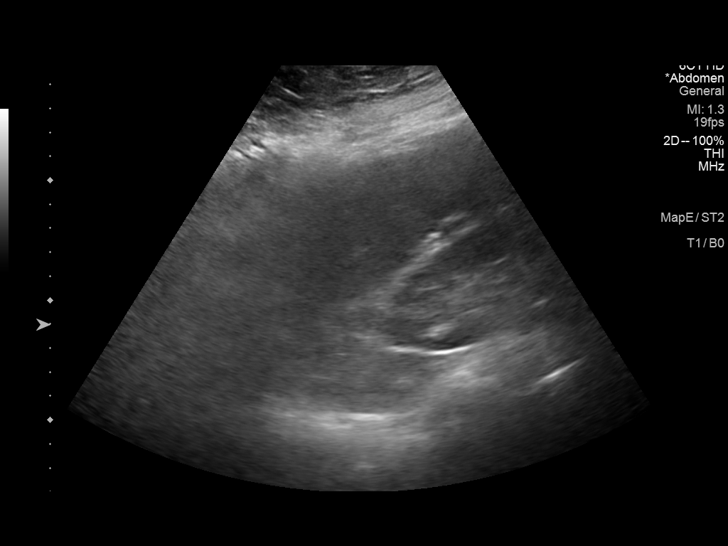
[im 62/75]
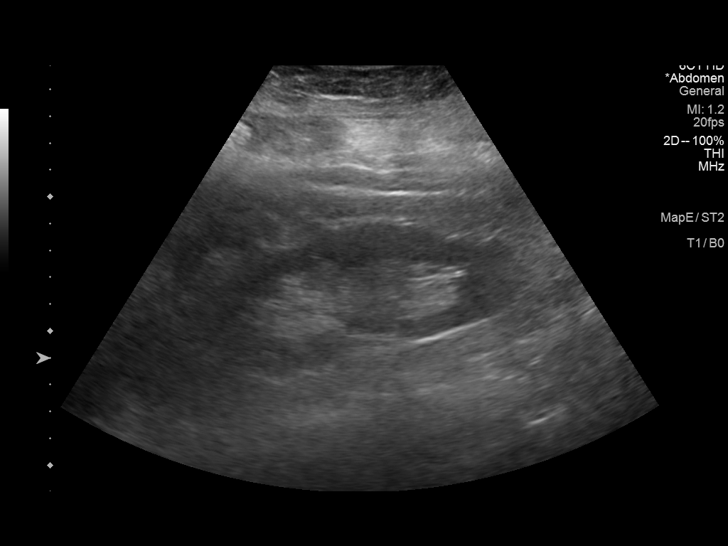
[im 68/75]
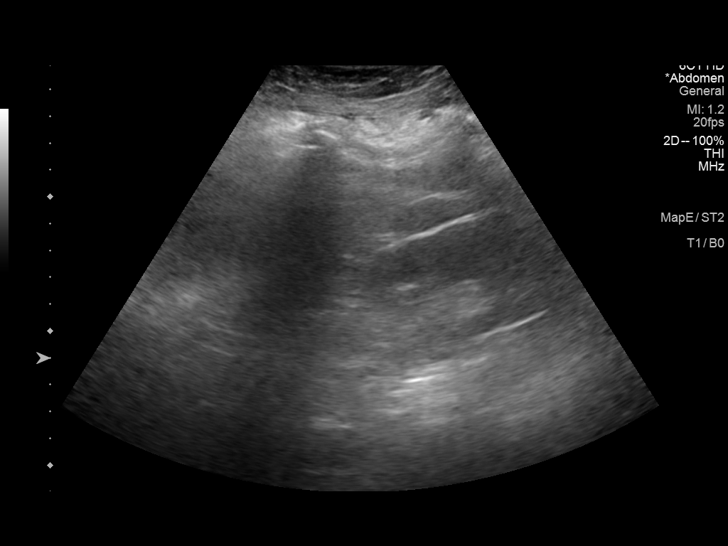
[im 75/75]
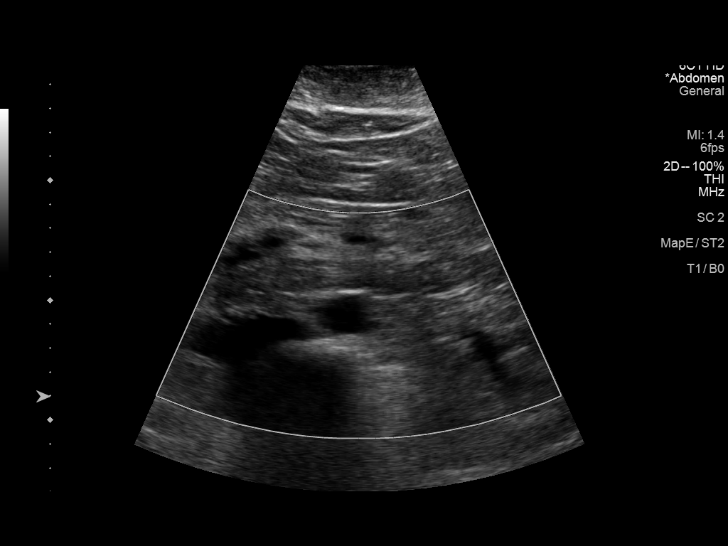

[13 of 25 positions shown; findings below may reference images not displayed]

FINDINGS: Gallbladder: Surgically absent. Previous fluid collection in the
gallbladder fossa has resolved.

Common bile duct: Diameter: 3 mm.

Liver: Diffusely increased and heterogeneous in parenchymal
echogenicity. Liver parenchyma is difficult to penetrate. No
discrete focal lesion. Portal vein is patent on color Doppler
imaging with normal direction of blood flow towards the liver.

IVC: No abnormality visualized, technically limited evaluation due
to habitus.

Pancreas: Not well visualized due to bowel gas and body habitus, no
gross focal abnormality.

Spleen: Size and appearance within normal limits.

Right Kidney: Length: 11.5 cm. Echogenicity within normal limits. No
mass or hydronephrosis visualized.

Left Kidney: Length: 11.9 cm. Echogenicity within normal limits. No
mass or hydronephrosis visualized.

Abdominal aorta: No aneurysm visualized.

Other findings: Technically challenging exam due to body habitus and
bowel gas in the abdomen.
IMPRESSION: 1. Postcholecystectomy with resolution of previous fluid collection
in the gallbladder fossa. No biliary dilatation.
2. Increased hepatic echogenicity consistent with steatosis. Liver
parenchyma is difficult to penetrate.
3. Otherwise unremarkable exam allowing for technical limitations.

## 2022-08-17 ENCOUNTER — Other Ambulatory Visit: Payer: Self-pay | Admitting: Otolaryngology

## 2022-08-17 DIAGNOSIS — H9313 Tinnitus, bilateral: Secondary | ICD-10-CM | POA: Diagnosis not present

## 2022-08-17 DIAGNOSIS — H93A2 Pulsatile tinnitus, left ear: Secondary | ICD-10-CM

## 2022-08-17 DIAGNOSIS — G369 Acute disseminated demyelination, unspecified: Secondary | ICD-10-CM | POA: Diagnosis not present

## 2022-09-10 ENCOUNTER — Ambulatory Visit
Admission: RE | Admit: 2022-09-10 | Discharge: 2022-09-10 | Disposition: A | Discharge status: Home / Self Care | Payer: Federal, State, Local not specified - PPO | Source: Ambulatory Visit | Attending: Otolaryngology | Admitting: Otolaryngology

## 2022-09-10 ENCOUNTER — Other Ambulatory Visit: Payer: Self-pay | Admitting: Otolaryngology

## 2022-09-10 DIAGNOSIS — H93A2 Pulsatile tinnitus, left ear: Secondary | ICD-10-CM

## 2022-09-10 DIAGNOSIS — H9313 Tinnitus, bilateral: Secondary | ICD-10-CM | POA: Diagnosis not present

## 2022-09-10 MED ORDER — GADOBENATE DIMEGLUMINE 529 MG/ML IV SOLN
20.0000 mL | Freq: Once | INTRAVENOUS | Status: AC | PRN
  Administered 2022-09-10: 20 mL via INTRAVENOUS

## 2022-12-18 ENCOUNTER — Encounter: Payer: Self-pay | Admitting: Family Medicine

## 2022-12-18 ENCOUNTER — Ambulatory Visit: Payer: Federal, State, Local not specified - PPO | Admitting: Family Medicine

## 2022-12-18 VITALS — BP 130/68 | HR 83 | Temp 97.9°F | Ht 66.0 in | Wt 268.5 lb

## 2022-12-18 DIAGNOSIS — Z13220 Encounter for screening for lipoid disorders: Secondary | ICD-10-CM

## 2022-12-18 DIAGNOSIS — Z862 Personal history of diseases of the blood and blood-forming organs and certain disorders involving the immune mechanism: Secondary | ICD-10-CM | POA: Diagnosis not present

## 2022-12-18 DIAGNOSIS — Z8349 Family history of other endocrine, nutritional and metabolic diseases: Secondary | ICD-10-CM

## 2022-12-18 DIAGNOSIS — Z1159 Encounter for screening for other viral diseases: Secondary | ICD-10-CM | POA: Diagnosis not present

## 2022-12-18 DIAGNOSIS — R300 Dysuria: Secondary | ICD-10-CM

## 2022-12-18 LAB — CBC WITH DIFFERENTIAL/PLATELET
Basophils Absolute: 0.1 10*3/uL (ref 0.0–0.1)
Basophils Relative: 0.7 % (ref 0.0–3.0)
Eosinophils Absolute: 0.1 10*3/uL (ref 0.0–0.7)
Eosinophils Relative: 1.6 % (ref 0.0–5.0)
HCT: 41.1 % (ref 36.0–46.0)
Hemoglobin: 13.8 g/dL (ref 12.0–15.0)
Lymphocytes Relative: 22.4 % (ref 12.0–46.0)
Lymphs Abs: 1.8 10*3/uL (ref 0.7–4.0)
MCHC: 33.7 g/dL (ref 30.0–36.0)
MCV: 88.3 fl (ref 78.0–100.0)
Monocytes Absolute: 0.4 10*3/uL (ref 0.1–1.0)
Monocytes Relative: 4.4 % (ref 3.0–12.0)
Neutro Abs: 5.8 10*3/uL (ref 1.4–7.7)
Neutrophils Relative %: 70.9 % (ref 43.0–77.0)
Platelets: 298 10*3/uL (ref 150.0–400.0)
RBC: 4.66 Mil/uL (ref 3.87–5.11)
RDW: 14.1 % (ref 11.5–15.5)
WBC: 8.2 10*3/uL (ref 4.0–10.5)

## 2022-12-18 LAB — LIPID PANEL
Cholesterol: 186 mg/dL (ref 0–200)
HDL: 66.5 mg/dL (ref >39.00)
LDL Cholesterol: 100 mg/dL — ABNORMAL HIGH (ref 0–99)
NonHDL: 119.19
Total CHOL/HDL Ratio: 3
Triglycerides: 98 mg/dL (ref 0.0–149.0)
VLDL: 19.6 mg/dL (ref 0.0–40.0)

## 2022-12-18 LAB — COMPREHENSIVE METABOLIC PANEL
ALT: 15 U/L (ref 0–35)
AST: 17 U/L (ref 0–37)
Albumin: 4.1 g/dL (ref 3.5–5.2)
Alkaline Phosphatase: 105 U/L (ref 39–117)
BUN: 8 mg/dL (ref 6–23)
CO2: 29 mEq/L (ref 19–32)
Calcium: 9.7 mg/dL (ref 8.4–10.5)
Chloride: 101 mEq/L (ref 96–112)
Creatinine, Ser: 0.67 mg/dL (ref 0.40–1.20)
GFR: 95.31 mL/min (ref >60.00)
Glucose, Bld: 133 mg/dL — ABNORMAL HIGH (ref 70–99)
Potassium: 4 mEq/L (ref 3.5–5.1)
Sodium: 139 mEq/L (ref 135–145)
Total Bilirubin: 0.9 mg/dL (ref 0.2–1.2)
Total Protein: 6.9 g/dL (ref 6.0–8.3)

## 2022-12-18 LAB — POC URINALSYSI DIPSTICK (AUTOMATED)
Bilirubin, UA: NEGATIVE
Blood, UA: NEGATIVE
Glucose, UA: NEGATIVE
Ketones, UA: NEGATIVE
Leukocytes, UA: NEGATIVE
Nitrite, UA: NEGATIVE
Protein, UA: NEGATIVE
Spec Grav, UA: <=1.005 — AB (ref 1.010–1.025)
Urobilinogen, UA: 0.2 E.U./dL
pH, UA: 6 (ref 5.0–8.0)

## 2022-12-18 LAB — T4, FREE: Free T4: 0.83 ng/dL (ref 0.60–1.60)

## 2022-12-18 LAB — TSH: TSH: 2.89 u[IU]/mL (ref 0.35–5.50)

## 2022-12-18 LAB — T3, FREE: T3, Free: 3.2 pg/mL (ref 2.3–4.2)

## 2022-12-18 NOTE — Patient Instructions (Addendum)
Please stop at the lab to have labs drawn.  Push water, add cranberry and D mannose.  We will call with urine culture results.  Call if new fever. Avoid bladder irritants such as spicy food, citrus, tomato, caffeine and alcohol.

## 2022-12-18 NOTE — Assessment & Plan Note (Signed)
Acute, symptoms most likely secondary to bladder irritation versus partially treated urinary tract infection.  Urinalysis is clear today but is extremely dilute given her high level of water.  I encouraged her to continue the high level of water intake, and cranberry and possibly d-mannose to continue to to treat possible resolving urinary tract infection.  We will send her urine for culture to make sure there is no bacteria remaining. I have also encouraged her to avoid bladder irritants such as spicy food, citrus, tomato, caffeine and alcohol.

## 2022-12-18 NOTE — Progress Notes (Signed)
Patient ID: Lori Kaiser, female    DOB: 1961-01-24, 62 y.o.   MRN: [id number]  This visit was conducted in person.  BP 130/68   Pulse 83   Temp 97.9 F (36.6 C) (Temporal)   Ht '5\' 6"'$  (1.676 m)   Wt 268 lb 8 oz (121.8 kg)   LMP 09/25/2017   SpO2 96%   BMI 43.34 kg/m    CC:  Chief Complaint  Patient presents with   Urinary Frequency   Dysuria    Subjective:   HPI: Lori Kaiser is a 62 y.o. female patient of Pleas Koch, NP presenting on 12/18/2022 for Urinary Frequency and Dysuria  Urinary Frequency  This is a new problem. The current episode started in the past 7 days. The problem has been unchanged. The quality of the pain is described as burning. The pain is moderate. There has been no fever. She is Not sexually active. There is No history of pyelonephritis. Associated symptoms include frequency. Pertinent negatives include no chills, flank pain, hematuria, hesitancy, nausea, possible pregnancy, urgency or vomiting. Associated symptoms comments: Body aches. She has tried increased fluids for the symptoms. The treatment provided mild relief. There is no history of catheterization, kidney stones, recurrent UTIs, a single kidney, urinary stasis or a urological procedure.  Dysuria  Associated symptoms include frequency. Pertinent negatives include no chills, flank pain, hematuria, hesitancy, nausea, possible pregnancy, urgency or vomiting. There is no history of catheterization, kidney stones, recurrent UTIs, a single kidney, urinary stasis or a urological procedure.    She is due for complete physical exam with her PCP and has requested screening lab work today.    Reviewed last office visit with PCP, reviewed last complete physical exam to determine what labs need to be done.  Relevant past medical, surgical, family and social history reviewed and updated as indicated. Interim medical history since our last visit reviewed. Allergies and medications reviewed and  updated. Outpatient Medications Prior to Visit  Medication Sig Dispense Refill   famotidine (PEPCID) 20 MG tablet Take 20 mg by mouth 2 (two) times daily.     fluticasone (FLONASE) 50 MCG/ACT nasal spray Place 2 sprays into both nostrils daily as needed for allergies or rhinitis.     loratadine (CLARITIN) 10 MG tablet Take 10 mg by mouth daily as needed for allergies.     No facility-administered medications prior to visit.     Per HPI unless specifically indicated in ROS section below Review of Systems  Constitutional:  Negative for chills.  Gastrointestinal:  Negative for nausea and vomiting.  Genitourinary:  Positive for dysuria and frequency. Negative for flank pain, hematuria, hesitancy and urgency.   Objective:  BP 130/68   Pulse 83   Temp 97.9 F (36.6 C) (Temporal)   Ht '5\' 6"'$  (1.676 m)   Wt 268 lb 8 oz (121.8 kg)   LMP 09/25/2017   SpO2 96%   BMI 43.34 kg/m   Wt Readings from Last 3 Encounters:  12/18/22 268 lb 8 oz (121.8 kg)  01/18/20 275 lb 4 oz (124.9 kg)  05/14/19 259 lb (117.5 kg)      Physical Exam Constitutional:      General: She is not in acute distress.    Appearance: Normal appearance. She is well-developed. She is not ill-appearing or toxic-appearing.  HENT:     Head: Normocephalic.     Right Ear: Hearing, tympanic membrane, ear canal and external ear normal. Tympanic membrane is not  erythematous, retracted or bulging.     Left Ear: Hearing, tympanic membrane, ear canal and external ear normal. Tympanic membrane is not erythematous, retracted or bulging.     Nose: No mucosal edema or rhinorrhea.     Right Sinus: No maxillary sinus tenderness or frontal sinus tenderness.     Left Sinus: No maxillary sinus tenderness or frontal sinus tenderness.     Mouth/Throat:     Pharynx: Uvula midline.  Eyes:     General: Lids are normal. Lids are everted, no foreign bodies appreciated.     Conjunctiva/sclera: Conjunctivae normal.     Pupils: Pupils are equal,  round, and reactive to light.  Neck:     Thyroid: No thyroid mass or thyromegaly.     Vascular: No carotid bruit.     Trachea: Trachea normal.  Cardiovascular:     Rate and Rhythm: Normal rate and regular rhythm.     Pulses: Normal pulses.     Heart sounds: Normal heart sounds, S1 normal and S2 normal. No murmur heard.    No friction rub. No gallop.  Pulmonary:     Effort: Pulmonary effort is normal. No tachypnea or respiratory distress.     Breath sounds: Normal breath sounds. No decreased breath sounds, wheezing, rhonchi or rales.  Abdominal:     General: Bowel sounds are normal.     Palpations: Abdomen is soft.     Tenderness: There is no abdominal tenderness.  Musculoskeletal:     Cervical back: Normal range of motion and neck supple.  Skin:    General: Skin is warm and dry.     Findings: No rash.  Neurological:     Mental Status: She is alert.  Psychiatric:        Mood and Affect: Mood is not anxious or depressed.        Speech: Speech normal.        Behavior: Behavior normal. Behavior is cooperative.        Thought Content: Thought content normal.        Judgment: Judgment normal.       Results for orders placed or performed in visit on 01/19/20  Hemoglobin A1c  Result Value Ref Range   Hgb A1c MFr Bld 5.6 4.6 - 6.5 %    Assessment and Plan  Dysuria    No follow-ups on file.   Eliezer Lofts, MD

## 2022-12-18 NOTE — Addendum Note (Signed)
Addended by: Carter Kitten on: 12/18/2022 10:39 AM   Modules accepted: Orders

## 2022-12-19 LAB — URINE CULTURE
MICRO NUMBER:: 14668401
SPECIMEN QUALITY:: ADEQUATE

## 2022-12-20 LAB — HEPATITIS C ANTIBODY: Hepatitis C Ab: NONREACTIVE

## 2022-12-21 ENCOUNTER — Other Ambulatory Visit (INDEPENDENT_AMBULATORY_CARE_PROVIDER_SITE_OTHER): Payer: Federal, State, Local not specified - PPO

## 2022-12-21 DIAGNOSIS — R7309 Other abnormal glucose: Secondary | ICD-10-CM

## 2022-12-21 LAB — HEMOGLOBIN A1C: Hgb A1c MFr Bld: 5.7 % (ref 4.6–6.5)

## 2023-02-02 ENCOUNTER — Ambulatory Visit (INDEPENDENT_AMBULATORY_CARE_PROVIDER_SITE_OTHER): Payer: Federal, State, Local not specified - PPO | Admitting: Primary Care

## 2023-02-02 ENCOUNTER — Encounter: Payer: Self-pay | Admitting: Primary Care

## 2023-02-02 VITALS — BP 134/72 | HR 82 | Temp 99.1°F | Ht 66.0 in | Wt 273.0 lb

## 2023-02-02 DIAGNOSIS — R229 Localized swelling, mass and lump, unspecified: Secondary | ICD-10-CM

## 2023-02-02 DIAGNOSIS — F411 Generalized anxiety disorder: Secondary | ICD-10-CM

## 2023-02-02 DIAGNOSIS — Z23 Encounter for immunization: Secondary | ICD-10-CM | POA: Diagnosis not present

## 2023-02-02 DIAGNOSIS — Z1231 Encounter for screening mammogram for malignant neoplasm of breast: Secondary | ICD-10-CM

## 2023-02-02 DIAGNOSIS — F3342 Major depressive disorder, recurrent, in full remission: Secondary | ICD-10-CM

## 2023-02-02 DIAGNOSIS — K219 Gastro-esophageal reflux disease without esophagitis: Secondary | ICD-10-CM

## 2023-02-02 DIAGNOSIS — G43009 Migraine without aura, not intractable, without status migrainosus: Secondary | ICD-10-CM

## 2023-02-02 DIAGNOSIS — R7303 Prediabetes: Secondary | ICD-10-CM

## 2023-02-02 DIAGNOSIS — Z0001 Encounter for general adult medical examination with abnormal findings: Secondary | ICD-10-CM

## 2023-02-02 DIAGNOSIS — Z1211 Encounter for screening for malignant neoplasm of colon: Secondary | ICD-10-CM

## 2023-02-02 NOTE — Assessment & Plan Note (Signed)
New diagnosis, reviewed A1C from March 2024.  Continue to work on a healthy diet and regular exercise. Repeat A1C in mid June 2024

## 2023-02-02 NOTE — Progress Notes (Signed)
Subjective:    Patient ID: Lori Kaiser, female    DOB: [birth year], 62 y.o.   MRN: [id number]  HPI  Lori Kaiser is a very pleasant 62 y.o. female who presents today to re-establish care and for complete physical. She saw Dr. Ermalene Searing in March 2024.  She would also like to discuss skin mass. Chronic for the last one year and located to the left hip. She denies changes in size. She also denies pain. She initially noticed it about 1 year ago when she began exercising at home, getting up and down from the floor.   Immunizations: -Tetanus: Completed about 10 years ago.  -Shingles: Never completed, declines   Diet: Fair diet, completes weight watchers.  Exercise: Lifts weights, exercise bike, walking - most every day of the week.  Eye exam: Completes annually  Dental exam: Completes semi-annually    Pap Smear: Due, follows with GYN Mammogram: Due   Colonoscopy: Never completed   BP Readings from Last 3 Encounters:  02/02/23 134/72  12/18/22 130/68  01/18/20 124/84   Wt Readings from Last 3 Encounters:  02/02/23 273 lb (123.8 kg)  12/18/22 268 lb 8 oz (121.8 kg)  01/18/20 275 lb 4 oz (124.9 kg)      Review of Systems  Constitutional:  Negative for unexpected weight change.  HENT:  Negative for rhinorrhea.   Eyes:  Negative for visual disturbance.  Respiratory:  Negative for cough and shortness of breath.   Cardiovascular:  Negative for chest pain.  Gastrointestinal:  Negative for constipation and diarrhea.  Genitourinary:  Negative for difficulty urinating.  Musculoskeletal:  Negative for arthralgias and myalgias.  Skin:  Negative for rash.  Allergic/Immunologic: Negative for environmental allergies.  Neurological:  Positive for headaches. Negative for dizziness.  Psychiatric/Behavioral:  The patient is not nervous/anxious.          Past Medical History:  Diagnosis Date   allergies    Allergy    Anemia    Anxiety    Blood transfusion without reported  diagnosis    Chronic headaches    Depression    Eating disorder    Fatigue    Generalized abdominal pain 01/18/2020   GERD (gastroesophageal reflux disease)    History of chicken pox    Irritable bowel syndrome (IBS)    Migraine    Urine incontinence     Social History   Socioeconomic History   Marital status: Divorced    Spouse name: Not on file   Number of children: 2   Years of education: Not on file   Highest education level: Not on file  Occupational History   Occupation: Occupational hygienist: [employer]  Tobacco Use   Smoking status: Former    Packs/day: 1.00    Years: 12.00    Additional pack years: 0.00    Total pack years: 12.00    Types: Cigarettes   Smokeless tobacco: Never  Substance and Sexual Activity   Alcohol use: No   Drug use: No   Sexual activity: Never  Other Topics Concern   Not on file  Social History Narrative   Divorced, twins age 76   Diet: moderate, water   Exercise: walks sporadically   Social Determinants of Health   Financial Resource Strain: Not on file  Food Insecurity: Not on file  Transportation Needs: Not on file  Physical Activity: Not on file  Stress: Not on file  Social Connections: Not on file  Intimate Partner Violence: Not on file    Past Surgical History:  Procedure Laterality Date   CESAREAN SECTION  1996   CHOLECYSTECTOMY N/A 01/21/2019   Procedure: LAPAROSCOPIC CHOLECYSTECTOMY -No grams;  Surgeon: Leafy Ro, MD;  Location: [facility] ORS;  Service: General;  Laterality: N/A;   Dental Implant  2013   DILATION AND CURETTAGE OF UTERUS     x2 Miscarriages 1994 & 1995    Family History  Problem Relation Age of Onset   Heart disease Father 57   Atrial fibrillation Father    Heart disease Paternal Aunt    Heart disease Paternal Grandmother    Lung cancer Paternal Grandmother    Stroke Paternal Grandmother    Lung cancer Paternal Grandfather    Lung cancer Maternal Grandfather    Kidney cancer  Maternal Grandmother     Allergies  Allergen Reactions   Zithromax [Azithromycin]    Penicillins Rash    Did it involve swelling of the face/tongue/throat, SOB, or low BP? No Did it involve sudden or severe rash/hives, skin peeling, or any reaction on the inside of your mouth or nose? YES Did you need to seek medical attention at a hospital or doctor's office? No When did it last happen?       If all above answers are "NO", may proceed with cephalosporin use.    Current Outpatient Medications on File Prior to Visit  Medication Sig Dispense Refill   famotidine (PEPCID) 20 MG tablet Take 20 mg by mouth 2 (two) times daily.     fluticasone (FLONASE) 50 MCG/ACT nasal spray Place 2 sprays into both nostrils daily as needed for allergies or rhinitis.     loratadine (CLARITIN) 10 MG tablet Take 10 mg by mouth daily as needed for allergies.     No current facility-administered medications on file prior to visit.    BP 134/72   Pulse 82   Temp 99.1 F (37.3 C) (Temporal)   Ht  (1.676 m)   Wt 273 lb (123.8 kg)   LMP 09/25/2017   SpO2 99%   BMI 44.06 kg/m  Objective:   Physical Exam HENT:     Right Ear: Tympanic membrane and ear canal normal.     Left Ear: Tympanic membrane and ear canal normal.     Nose: Nose normal.  Eyes:     Conjunctiva/sclera: Conjunctivae normal.     Pupils: Pupils are equal, round, and reactive to light.  Neck:     Thyroid: No thyromegaly.  Cardiovascular:     Rate and Rhythm: Normal rate and regular rhythm.     Heart sounds: No murmur heard. Pulmonary:     Effort: Pulmonary effort is normal.     Breath sounds: Normal breath sounds. No rales.  Abdominal:     General: Bowel sounds are normal.     Palpations: Abdomen is soft.     Tenderness: There is no abdominal tenderness.  Musculoskeletal:        General: Normal range of motion.     Cervical back: Neck supple.  Lymphadenopathy:     Cervical: No cervical adenopathy.  Skin:    General: Skin  is warm and dry.     Findings: No rash.     Comments: Softball sized, deep, soft tissue mass noted to left lateral buttocks. Non tender.  Neurological:     Mental Status: She is alert and oriented to person, place, and time.     Cranial Nerves: No cranial  nerve deficit.     Deep Tendon Reflexes: Reflexes are normal and symmetric.  Psychiatric:        Mood and Affect: Mood normal.           Assessment & Plan:  Migraine without aura and without status migrainosus, not intractable Assessment & Plan: Controlled.  No concerns today. Continue to monitor.    Gastroesophageal reflux disease, unspecified whether esophagitis present Assessment & Plan: Controlled.  Continue famotidine 20 mg BID.    Generalized anxiety disorder Assessment & Plan: Controlled.  No concerns today. Continue to monitor.    Recurrent major depressive disorder, in full remission Assessment & Plan: Controlled.  No concerns today. Continue to monitor.    Prediabetes Assessment & Plan: New diagnosis, reviewed A1C from March 2024.  Continue to work on a healthy diet and regular exercise. Repeat A1C in mid June 2024   Screening mammogram for breast cancer -     3D Screening Mammogram, Left and Right; Future  Screening for colon cancer -     Cologuard  Localized skin mass, lump, or swelling Assessment & Plan: Likely lipoma or cyst.  Given size and location, will obtain soft tissue US for a definitive diagnosis. Orders placed.   Orders: -     [place] LT LOWER EXTREM LTD SOFT TISSUE NON VASCULAR; Future  Encounter for annual general medical examination with abnormal findings in adult Assessment & Plan: Tetanus due, provided today. Declines Shingrix vaccines. Pap smear due, she would like to have this done at GYN office Mammogram due, orders placed. Colonoscopy overdue, she declines but agrees to Cologuard.  Discussed the importance of a healthy diet and regular exercise in order for  weight loss, and to reduce the risk of further co-morbidity. Labs reviewed  Follow up in 1 year for repeat physical.          Doreene Nest, NP

## 2023-02-02 NOTE — Patient Instructions (Signed)
You will be contacted via phone regarding your ultrasound.   [facility] to schedule your [facility].   Complete the Cologuard Kit once it arrives.   Schedule a lab only appointment for mid June to repeat your A1C diabetes test.  It was a pleasure to see you today!

## 2023-02-02 NOTE — Assessment & Plan Note (Signed)
Likely lipoma or cyst.  Given size and location, will obtain soft tissue US for a definitive diagnosis. Orders placed.

## 2023-02-02 NOTE — Assessment & Plan Note (Signed)
Tetanus due, provided today. Declines Shingrix vaccines. Pap smear due, she would like to have this done at GYN office Mammogram due, orders placed. Colonoscopy overdue, she declines but agrees to Cologuard.  Discussed the importance of a healthy diet and regular exercise in order for weight loss, and to reduce the risk of further co-morbidity. Labs reviewed  Follow up in 1 year for repeat physical.

## 2023-02-02 NOTE — Assessment & Plan Note (Signed)
Controlled.  No concerns today. Continue to monitor.  

## 2023-02-02 NOTE — Assessment & Plan Note (Signed)
Controlled.  Continue famotidine 20 mg BID. 

## 2023-02-03 ENCOUNTER — Encounter: Payer: Self-pay | Admitting: *Deleted

## 2023-02-24 DIAGNOSIS — H35372 Puckering of macula, left eye: Secondary | ICD-10-CM | POA: Diagnosis not present

## 2023-02-24 DIAGNOSIS — H353210 Exudative age-related macular degeneration, right eye, stage unspecified: Secondary | ICD-10-CM | POA: Diagnosis not present

## 2023-02-24 DIAGNOSIS — H353122 Nonexudative age-related macular degeneration, left eye, intermediate dry stage: Secondary | ICD-10-CM | POA: Diagnosis not present

## 2023-02-24 DIAGNOSIS — H43811 Vitreous degeneration, right eye: Secondary | ICD-10-CM | POA: Diagnosis not present

## 2023-02-24 DIAGNOSIS — H52223 Regular astigmatism, bilateral: Secondary | ICD-10-CM | POA: Diagnosis not present

## 2023-02-25 ENCOUNTER — Ambulatory Visit
Admission: RE | Admit: 2023-02-25 | Discharge: 2023-02-25 | Disposition: A | Discharge status: Home / Self Care | Payer: Federal, State, Local not specified - PPO | Source: Ambulatory Visit | Attending: Primary Care | Admitting: Primary Care

## 2023-02-25 DIAGNOSIS — R229 Localized swelling, mass and lump, unspecified: Secondary | ICD-10-CM | POA: Diagnosis not present

## 2023-02-25 DIAGNOSIS — R2242 Localized swelling, mass and lump, left lower limb: Secondary | ICD-10-CM | POA: Diagnosis not present

## 2023-03-04 ENCOUNTER — Other Ambulatory Visit: Payer: Self-pay | Admitting: Primary Care

## 2023-03-04 DIAGNOSIS — R229 Localized swelling, mass and lump, unspecified: Secondary | ICD-10-CM

## 2023-03-05 ENCOUNTER — Encounter: Payer: Self-pay | Admitting: *Deleted

## 2023-03-05 DIAGNOSIS — Z1231 Encounter for screening mammogram for malignant neoplasm of breast: Secondary | ICD-10-CM

## 2023-03-05 DIAGNOSIS — R229 Localized swelling, mass and lump, unspecified: Secondary | ICD-10-CM

## 2023-03-17 NOTE — Telephone Encounter (Signed)
[facility] building patient would prefer to go to that stand alone location as opposed to the hospital  She cancelled her appointments so that she could have them done at that location  Please follow-up with the patient, not sure if order needs to be changed.

## 2023-03-17 NOTE — Telephone Encounter (Signed)
Locations will need to be changed on all orders to [facility] [place] -- these will have to be precerted for the new location  Thanks!

## 2023-03-18 NOTE — Telephone Encounter (Signed)
Noted.  Location for both orders changed to [facility] [place]

## 2023-03-22 NOTE — Telephone Encounter (Signed)
Noted  

## 2023-03-22 NOTE — Telephone Encounter (Signed)
Noted.   FYI  Patient is scheduled with DR [facility] 04/08/2023 09:20 AM Scheduled [facility] [place] MRI IMG MR HIP LT WWO CONTRAST-GI

## 2023-03-23 ENCOUNTER — Other Ambulatory Visit: Payer: Self-pay | Admitting: Primary Care

## 2023-03-23 DIAGNOSIS — R7303 Prediabetes: Secondary | ICD-10-CM

## 2023-03-25 ENCOUNTER — Ambulatory Visit: Payer: Federal, State, Local not specified - PPO

## 2023-03-31 ENCOUNTER — Other Ambulatory Visit (INDEPENDENT_AMBULATORY_CARE_PROVIDER_SITE_OTHER): Payer: Federal, State, Local not specified - PPO

## 2023-03-31 DIAGNOSIS — R7303 Prediabetes: Secondary | ICD-10-CM

## 2023-03-31 LAB — POCT GLYCOSYLATED HEMOGLOBIN (HGB A1C): Hemoglobin A1C: 5.7 % — AB (ref 4.0–5.6)

## 2023-04-01 ENCOUNTER — Ambulatory Visit
Admission: RE | Admit: 2023-04-01 | Discharge: 2023-04-01 | Disposition: A | Discharge status: Home / Self Care | Payer: Federal, State, Local not specified - PPO | Source: Ambulatory Visit | Attending: Primary Care | Admitting: Primary Care

## 2023-04-01 DIAGNOSIS — Z1231 Encounter for screening mammogram for malignant neoplasm of breast: Secondary | ICD-10-CM | POA: Diagnosis not present

## 2023-04-05 ENCOUNTER — Other Ambulatory Visit: Payer: Self-pay | Admitting: Primary Care

## 2023-04-05 DIAGNOSIS — R928 Other abnormal and inconclusive findings on diagnostic imaging of breast: Secondary | ICD-10-CM

## 2023-04-05 DIAGNOSIS — R921 Mammographic calcification found on diagnostic imaging of breast: Secondary | ICD-10-CM

## 2023-04-07 ENCOUNTER — Ambulatory Visit: Payer: Federal, State, Local not specified - PPO

## 2023-04-08 ENCOUNTER — Ambulatory Visit
Admission: RE | Admit: 2023-04-08 | Discharge: 2023-04-08 | Disposition: A | Discharge status: Home / Self Care | Payer: Federal, State, Local not specified - PPO | Source: Ambulatory Visit | Attending: Primary Care | Admitting: Primary Care

## 2023-04-08 DIAGNOSIS — R2242 Localized swelling, mass and lump, left lower limb: Secondary | ICD-10-CM | POA: Diagnosis not present

## 2023-04-08 DIAGNOSIS — R229 Localized swelling, mass and lump, unspecified: Secondary | ICD-10-CM

## 2023-04-08 MED ORDER — GADOPICLENOL 0.5 MMOL/ML IV SOLN
10.0000 mL | Freq: Once | INTRAVENOUS | Status: AC | PRN
  Administered 2023-04-08: 10 mL via INTRAVENOUS

## 2023-04-09 ENCOUNTER — Ambulatory Visit
Admission: RE | Admit: 2023-04-09 | Discharge: 2023-04-09 | Disposition: A | Discharge status: Home / Self Care | Payer: Federal, State, Local not specified - PPO | Source: Ambulatory Visit | Attending: Primary Care | Admitting: Primary Care

## 2023-04-09 DIAGNOSIS — R921 Mammographic calcification found on diagnostic imaging of breast: Secondary | ICD-10-CM

## 2023-04-09 DIAGNOSIS — R928 Other abnormal and inconclusive findings on diagnostic imaging of breast: Secondary | ICD-10-CM

## 2023-04-09 DIAGNOSIS — R92322 Mammographic fibroglandular density, left breast: Secondary | ICD-10-CM | POA: Diagnosis not present

## 2023-04-09 DIAGNOSIS — R920 Mammographic microcalcification found on diagnostic imaging of breast: Secondary | ICD-10-CM | POA: Diagnosis not present

## 2023-04-12 ENCOUNTER — Other Ambulatory Visit: Payer: Self-pay | Admitting: Primary Care

## 2023-04-12 DIAGNOSIS — R928 Other abnormal and inconclusive findings on diagnostic imaging of breast: Secondary | ICD-10-CM

## 2023-04-12 DIAGNOSIS — R921 Mammographic calcification found on diagnostic imaging of breast: Secondary | ICD-10-CM

## 2023-04-20 ENCOUNTER — Ambulatory Visit
Admission: RE | Admit: 2023-04-20 | Discharge: 2023-04-20 | Disposition: A | Discharge status: Home / Self Care | Payer: Federal, State, Local not specified - PPO | Source: Ambulatory Visit | Attending: Primary Care | Admitting: Primary Care

## 2023-04-20 DIAGNOSIS — R921 Mammographic calcification found on diagnostic imaging of breast: Secondary | ICD-10-CM | POA: Diagnosis not present

## 2023-04-20 DIAGNOSIS — R928 Other abnormal and inconclusive findings on diagnostic imaging of breast: Secondary | ICD-10-CM

## 2023-04-20 DIAGNOSIS — N6012 Diffuse cystic mastopathy of left breast: Secondary | ICD-10-CM | POA: Diagnosis not present

## 2023-04-20 HISTORY — PX: BREAST BIOPSY: SHX20

## 2023-04-20 MED ORDER — LIDOCAINE HCL 1 % IJ SOLN
5.0000 mL | Freq: Once | INTRAMUSCULAR | Status: AC
  Administered 2023-04-20: 5 mL
  Filled 2023-04-20: qty 5

## 2023-04-20 MED ORDER — LIDOCAINE-EPINEPHRINE 1 %-1:100000 IJ SOLN
20.0000 mL | Freq: Once | INTRAMUSCULAR | Status: AC
  Administered 2023-04-20: 20 mL
  Filled 2023-04-20: qty 20

## 2023-04-20 MED ORDER — LIDOCAINE-EPINEPHRINE 1 %-1:100000 IJ SOLN
10.0000 mL | Freq: Once | INTRAMUSCULAR | Status: AC
  Administered 2023-04-20: 10 mL
  Filled 2023-04-20: qty 10

## 2023-04-20 MED ORDER — LIDOCAINE 1 % OPTIME INJ - NO CHARGE
5.0000 mL | Freq: Once | INTRAMUSCULAR | Status: AC
  Administered 2023-04-20: 5 mL
  Filled 2023-04-20: qty 6

## 2023-04-21 ENCOUNTER — Encounter: Payer: Self-pay | Admitting: *Deleted

## 2023-04-21 NOTE — Progress Notes (Signed)
Referral recieved from [facility] for benign breast mass.  Options of surgeons in the area given to Lori Kaiser and she will decide who she would like to see and give me a call back.

## 2023-04-23 ENCOUNTER — Encounter: Payer: Self-pay | Admitting: *Deleted

## 2023-04-23 NOTE — Progress Notes (Signed)
Ms. Beilke has decided she would like to see Dr. Carolynne Edouard at [facility].   Referral sent and their office will call with the appt.

## 2023-05-25 ENCOUNTER — Ambulatory Visit: Payer: Self-pay | Admitting: General Surgery

## 2023-05-25 DIAGNOSIS — N6489 Other specified disorders of breast: Secondary | ICD-10-CM | POA: Diagnosis not present

## 2023-05-25 DIAGNOSIS — N6092 Unspecified benign mammary dysplasia of left breast: Secondary | ICD-10-CM

## 2023-05-25 MED ORDER — KETOROLAC TROMETHAMINE 15 MG/ML IJ SOLN: 15.0000 mg | Freq: Once | INTRAMUSCULAR | Status: AC

## 2023-05-28 ENCOUNTER — Other Ambulatory Visit: Payer: Self-pay | Admitting: General Surgery

## 2023-05-28 DIAGNOSIS — N6489 Other specified disorders of breast: Secondary | ICD-10-CM

## 2023-06-16 ENCOUNTER — Other Ambulatory Visit: Payer: Self-pay

## 2023-06-16 ENCOUNTER — Encounter (HOSPITAL_BASED_OUTPATIENT_CLINIC_OR_DEPARTMENT_OTHER): Payer: Self-pay | Admitting: General Surgery

## 2023-06-22 ENCOUNTER — Ambulatory Visit
Admission: RE | Admit: 2023-06-22 | Discharge: 2023-06-22 | Disposition: A | Discharge status: Home / Self Care | Payer: Federal, State, Local not specified - PPO | Source: Ambulatory Visit | Attending: General Surgery | Admitting: General Surgery

## 2023-06-22 DIAGNOSIS — N6489 Other specified disorders of breast: Secondary | ICD-10-CM

## 2023-06-22 DIAGNOSIS — N6082 Other benign mammary dysplasias of left breast: Secondary | ICD-10-CM | POA: Diagnosis not present

## 2023-06-22 DIAGNOSIS — N6092 Unspecified benign mammary dysplasia of left breast: Secondary | ICD-10-CM

## 2023-06-22 HISTORY — PX: BREAST BIOPSY: SHX20

## 2023-06-22 MED ORDER — CHLORHEXIDINE GLUCONATE CLOTH 2 % EX PADS: 6.0000 | MEDICATED_PAD | Freq: Once | CUTANEOUS | Status: DC

## 2023-06-22 NOTE — Progress Notes (Signed)

## 2023-06-24 ENCOUNTER — Ambulatory Visit
Admission: RE | Admit: 2023-06-24 | Discharge: 2023-06-24 | Disposition: A | Discharge status: Home / Self Care | Payer: Federal, State, Local not specified - PPO | Source: Ambulatory Visit | Attending: General Surgery | Admitting: General Surgery

## 2023-06-24 ENCOUNTER — Other Ambulatory Visit: Payer: Self-pay

## 2023-06-24 ENCOUNTER — Encounter (HOSPITAL_BASED_OUTPATIENT_CLINIC_OR_DEPARTMENT_OTHER): Payer: Self-pay | Admitting: General Surgery

## 2023-06-24 ENCOUNTER — Ambulatory Visit (HOSPITAL_BASED_OUTPATIENT_CLINIC_OR_DEPARTMENT_OTHER): Payer: Federal, State, Local not specified - PPO | Admitting: Anesthesiology

## 2023-06-24 ENCOUNTER — Encounter (HOSPITAL_BASED_OUTPATIENT_CLINIC_OR_DEPARTMENT_OTHER)
Admission: RE | Disposition: A | Discharge status: Home / Self Care | Payer: Self-pay | Source: Home / Self Care | Attending: General Surgery

## 2023-06-24 ENCOUNTER — Ambulatory Visit (HOSPITAL_BASED_OUTPATIENT_CLINIC_OR_DEPARTMENT_OTHER)
Admission: RE | Admit: 2023-06-24 | Discharge: 2023-06-24 | Disposition: A | Discharge status: Home / Self Care | Payer: Federal, State, Local not specified - PPO | Attending: General Surgery | Admitting: General Surgery

## 2023-06-24 DIAGNOSIS — N6092 Unspecified benign mammary dysplasia of left breast: Secondary | ICD-10-CM | POA: Insufficient documentation

## 2023-06-24 DIAGNOSIS — N6489 Other specified disorders of breast: Secondary | ICD-10-CM | POA: Insufficient documentation

## 2023-06-24 DIAGNOSIS — Z87891 Personal history of nicotine dependence: Secondary | ICD-10-CM | POA: Insufficient documentation

## 2023-06-24 DIAGNOSIS — Z1231 Encounter for screening mammogram for malignant neoplasm of breast: Secondary | ICD-10-CM | POA: Insufficient documentation

## 2023-06-24 DIAGNOSIS — Z01818 Encounter for other preprocedural examination: Secondary | ICD-10-CM

## 2023-06-24 DIAGNOSIS — N6012 Diffuse cystic mastopathy of left breast: Secondary | ICD-10-CM | POA: Diagnosis not present

## 2023-06-24 DIAGNOSIS — N6082 Other benign mammary dysplasias of left breast: Secondary | ICD-10-CM | POA: Diagnosis not present

## 2023-06-24 HISTORY — PX: BREAST LUMPECTOMY WITH RADIOACTIVE SEED LOCALIZATION: SHX6424

## 2023-06-24 SURGERY — BREAST LUMPECTOMY WITH RADIOACTIVE SEED LOCALIZATION
Anesthesia: General | Site: Breast | Laterality: Left

## 2023-06-24 MED ORDER — FENTANYL CITRATE (PF) 100 MCG/2ML IJ SOLN
INTRAMUSCULAR | Status: DC | PRN
  Administered 2023-06-24: 25 ug via INTRAVENOUS
  Administered 2023-06-24: 50 ug via INTRAVENOUS
  Administered 2023-06-24: 25 ug via INTRAVENOUS

## 2023-06-24 MED ORDER — MIDAZOLAM HCL 2 MG/2ML IJ SOLN
INTRAMUSCULAR | Status: DC | PRN
  Administered 2023-06-24: 2 mg via INTRAVENOUS

## 2023-06-24 MED ORDER — CELECOXIB 200 MG PO CAPS
ORAL_CAPSULE | ORAL | Status: AC
  Filled 2023-06-24: qty 1

## 2023-06-24 MED ORDER — ACETAMINOPHEN 500 MG PO TABS
ORAL_TABLET | ORAL | Status: AC
  Filled 2023-06-24: qty 2

## 2023-06-24 MED ORDER — ACETAMINOPHEN 500 MG PO TABS
1000.0000 mg | ORAL_TABLET | ORAL | Status: AC
  Administered 2023-06-24: 1000 mg via ORAL

## 2023-06-24 MED ORDER — OXYCODONE HCL 5 MG/5ML PO SOLN: 5.0000 mg | Freq: Once | ORAL | Status: DC | PRN

## 2023-06-24 MED ORDER — MIDAZOLAM HCL 2 MG/2ML IJ SOLN
INTRAMUSCULAR | Status: AC
  Filled 2023-06-24: qty 2

## 2023-06-24 MED ORDER — FENTANYL CITRATE (PF) 100 MCG/2ML IJ SOLN
INTRAMUSCULAR | Status: AC
  Filled 2023-06-24: qty 2

## 2023-06-24 MED ORDER — VANCOMYCIN HCL 1500 MG/300ML IV SOLN: 1500.0000 mg | INTRAVENOUS | Status: DC

## 2023-06-24 MED ORDER — DROPERIDOL 2.5 MG/ML IJ SOLN: 0.6250 mg | Freq: Once | INTRAMUSCULAR | Status: DC | PRN

## 2023-06-24 MED ORDER — GABAPENTIN 100 MG PO CAPS
ORAL_CAPSULE | ORAL | Status: AC
  Filled 2023-06-24: qty 1

## 2023-06-24 MED ORDER — DEXMEDETOMIDINE HCL IN NACL 80 MCG/20ML IV SOLN
INTRAVENOUS | Status: DC | PRN
  Administered 2023-06-24: 8 ug via INTRAVENOUS

## 2023-06-24 MED ORDER — GABAPENTIN 100 MG PO CAPS
100.0000 mg | ORAL_CAPSULE | ORAL | Status: AC
  Administered 2023-06-24: 100 mg via ORAL

## 2023-06-24 MED ORDER — FENTANYL CITRATE (PF) 100 MCG/2ML IJ SOLN: 25.0000 ug | INTRAMUSCULAR | Status: DC | PRN

## 2023-06-24 MED ORDER — VANCOMYCIN HCL 500 MG/100ML IV SOLN
INTRAVENOUS | Status: AC
  Filled 2023-06-24: qty 100

## 2023-06-24 MED ORDER — PROPOFOL 10 MG/ML IV BOLUS
INTRAVENOUS | Status: DC | PRN
  Administered 2023-06-24: 250 mg via INTRAVENOUS

## 2023-06-24 MED ORDER — VANCOMYCIN HCL 500 MG/100ML IV SOLN: 500.0000 mg | INTRAVENOUS | Status: DC

## 2023-06-24 MED ORDER — BUPIVACAINE-EPINEPHRINE (PF) 0.25% -1:200000 IJ SOLN
INTRAMUSCULAR | Status: DC | PRN
  Administered 2023-06-24: 20 mL

## 2023-06-24 MED ORDER — OXYCODONE HCL 5 MG PO TABS: 5.0000 mg | ORAL_TABLET | Freq: Once | ORAL | Status: DC | PRN

## 2023-06-24 MED ORDER — VANCOMYCIN HCL IN DEXTROSE 1-5 GM/200ML-% IV SOLN
1000.0000 mg | INTRAVENOUS | Status: AC
  Administered 2023-06-24: 1000 mg via INTRAVENOUS

## 2023-06-24 MED ORDER — DEXAMETHASONE SODIUM PHOSPHATE 4 MG/ML IJ SOLN
INTRAMUSCULAR | Status: DC | PRN
  Administered 2023-06-24: 8 mg via INTRAVENOUS

## 2023-06-24 MED ORDER — EPHEDRINE SULFATE (PRESSORS) 50 MG/ML IJ SOLN
INTRAMUSCULAR | Status: DC | PRN
  Administered 2023-06-24: 5 mg via INTRAVENOUS

## 2023-06-24 MED ORDER — ONDANSETRON HCL 4 MG/2ML IJ SOLN
INTRAMUSCULAR | Status: DC | PRN
  Administered 2023-06-24: 4 mg via INTRAVENOUS

## 2023-06-24 MED ORDER — LIDOCAINE HCL (CARDIAC) PF 100 MG/5ML IV SOSY
PREFILLED_SYRINGE | INTRAVENOUS | Status: DC | PRN
  Administered 2023-06-24: 100 mg via INTRAVENOUS

## 2023-06-24 MED ORDER — VANCOMYCIN HCL IN DEXTROSE 1-5 GM/200ML-% IV SOLN
INTRAVENOUS | Status: AC
  Filled 2023-06-24: qty 200

## 2023-06-24 MED ORDER — LACTATED RINGERS IV SOLN: INTRAVENOUS | Status: DC

## 2023-06-24 MED ORDER — OXYCODONE HCL 5 MG PO TABS: 5.0000 mg | ORAL_TABLET | Freq: Four times a day (QID) | ORAL | 0 refills | Status: DC | PRN

## 2023-06-24 MED ORDER — CELECOXIB 200 MG PO CAPS: 200.0000 mg | ORAL_CAPSULE | Freq: Once | ORAL | Status: DC

## 2023-06-24 SURGICAL SUPPLY — 41 items
ADH SKN CLS APL DERMABOND .7 (GAUZE/BANDAGES/DRESSINGS) ×1
APL PRP STRL LF DISP 70% ISPRP (MISCELLANEOUS) ×1
APPLIER CLIP 9.375 MED OPEN (MISCELLANEOUS)
APR CLP MED 9.3 20 MLT OPN (MISCELLANEOUS)
BLADE SURG 15 STRL LF DISP TIS (BLADE) ×1 IMPLANT
BLADE SURG 15 STRL SS (BLADE) ×1
CANISTER SUC SOCK COL 7IN (MISCELLANEOUS) ×1 IMPLANT
CANISTER SUCT 1200ML W/VALVE (MISCELLANEOUS) ×1 IMPLANT
CHLORAPREP W/TINT 26 (MISCELLANEOUS) ×1 IMPLANT
CLIP APPLIE 9.375 MED OPEN (MISCELLANEOUS) IMPLANT
COVER BACK TABLE 60X90IN (DRAPES) ×1 IMPLANT
COVER MAYO STAND STRL (DRAPES) ×1 IMPLANT
COVER PROBE CYLINDRICAL 5X96 (MISCELLANEOUS) ×1 IMPLANT
DERMABOND ADVANCED .7 DNX12 (GAUZE/BANDAGES/DRESSINGS) ×1 IMPLANT
DRAPE LAPAROSCOPIC ABDOMINAL (DRAPES) ×1 IMPLANT
DRAPE UTILITY XL STRL (DRAPES) ×1 IMPLANT
ELECT COATED BLADE 2.86 ST (ELECTRODE) ×1 IMPLANT
ELECT REM PT RETURN 9FT ADLT (ELECTROSURGICAL) ×1
ELECTRODE REM PT RTRN 9FT ADLT (ELECTROSURGICAL) ×1 IMPLANT
GLOVE BIO SURGEON STRL SZ 6.5 (GLOVE) IMPLANT
GLOVE BIO SURGEON STRL SZ7.5 (GLOVE) ×2 IMPLANT
GLOVE BIOGEL PI IND STRL 7.0 (GLOVE) IMPLANT
GOWN STRL REUS W/ TWL LRG LVL3 (GOWN DISPOSABLE) ×2 IMPLANT
GOWN STRL REUS W/TWL LRG LVL3 (GOWN DISPOSABLE) ×2
KIT MARKER MARGIN INK (KITS) ×1 IMPLANT
NDL HYPO 25X1 1.5 SAFETY (NEEDLE) IMPLANT
NEEDLE HYPO 25X1 1.5 SAFETY (NEEDLE) ×1
NS IRRIG 1000ML POUR BTL (IV SOLUTION) IMPLANT
PACK BASIN DAY SURGERY FS (CUSTOM PROCEDURE TRAY) ×1 IMPLANT
PENCIL SMOKE EVACUATOR (MISCELLANEOUS) ×1 IMPLANT
SLEEVE SCD COMPRESS KNEE MED (STOCKING) ×1 IMPLANT
SPIKE FLUID TRANSFER (MISCELLANEOUS) IMPLANT
SPONGE T-LAP 18X18 ~~LOC~~+RFID (SPONGE) ×1 IMPLANT
SUT MON AB 4-0 PC3 18 (SUTURE) ×1 IMPLANT
SUT SILK 2 0 SH (SUTURE) IMPLANT
SUT VICRYL 3-0 CR8 SH (SUTURE) ×1 IMPLANT
SYR CONTROL 10ML LL (SYRINGE) IMPLANT
TOWEL GREEN STERILE FF (TOWEL DISPOSABLE) ×1 IMPLANT
TRAY FAXITRON CT DISP (TRAY / TRAY PROCEDURE) ×1 IMPLANT
TUBE CONNECTING 20X1/4 (TUBING) ×1 IMPLANT
YANKAUER SUCT BULB TIP NO VENT (SUCTIONS) IMPLANT

## 2023-06-24 NOTE — Anesthesia Procedure Notes (Signed)
Procedure Name: LMA Insertion Date/Time: 06/24/2023 10:04 AM  Performed by: Earmon Phoenix, CRNAPre-anesthesia Checklist: Patient identified, Emergency Drugs available, Suction available, Patient being monitored and Timeout performed Patient Re-evaluated:Patient Re-evaluated prior to induction Oxygen Delivery Method: Circle system utilized Preoxygenation: Pre-oxygenation with 100% oxygen Induction Type: IV induction Ventilation: Mask ventilation without difficulty LMA: LMA inserted LMA Size: 4.0 Number of attempts: 1 Placement Confirmation: positive ETCO2 and breath sounds checked- equal and bilateral Tube secured with: Tape Dental Injury: Teeth and Oropharynx as per pre-operative assessment

## 2023-06-24 NOTE — H&P (Signed)
REFERRING PHYSICIAN: Vernona Rieger, NP PROVIDER: Lindell Noe, MD MRN: [id number] DOB: [birth year] Subjective   Chief Complaint: NEW BREAST  History of Present Illness: Lori Kaiser is a 62 y.o. female who is seen today as an office consultation for evaluation of NEW BREAST  We are asked to see the patient in consultation by Dr. Jerelyn Charles to evaluate her for a complex sclerosing lesion. The patient is a 63 year old white female who recently went for a routine screening mammogram. At that time she was found to have 3 small abnormalities in the upper outer quadrant of the left breast. All 3 were biopsied. 2 of them came back as pseudoangiomatous stromal hyperplasia which is benign. The third came back with atypical ductal hyperplasia. She denies any family history of breast cancer. She is otherwise in pretty good health and does not smoke.  Review of Systems: A complete review of systems was obtained from the patient. I have reviewed this information and discussed as appropriate with the patient. See HPI as well for other ROS.  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 (CMS/HHS-HCC)  Headache(784.0)  Atypical ductal hyperplasia of left breast  Pseudoangiomatous stromal hyperplasia of breast   Past Surgical History:  Procedure Laterality Date  CHOLECYSTECTOMY 2020  CESAREAN SECTION  DILATION AND CURETTAGE, DIAGNOSTIC / THERAPEUTIC    Allergies  Allergen Reactions  Azithromycin Other (See Comments)  Penicillins Unknown   Current Outpatient Medications on File Prior to Visit  Medication Sig Dispense Refill  famotidine (PEPCID) 20 MG tablet Take 20 mg by mouth 2 (two) times daily  fluticasone propionate (FLONASE) 50 mcg/actuation nasal  spray Place 2 sprays into one nostril once daily as needed  loratadine (CLARITIN) 10 mg tablet Take 10 mg by mouth daily.  multivit-min/iron/FA/vit K/lut (CENTRUM SILVER WOMEN ORAL) Take by mouth  desloratadine (CLARINEX) 5 mg tablet 1 tab by mouth daily (Patient not taking: Reported on 05/25/2023)  FOLIC ACID/MV,FE,OTHER MIN (CENTRUM ORAL) Take by mouth. (Patient not taking: Reported on 05/25/2023)  GLUCOSAMINE HCL/CHONDR SU A NA (OSTEO BI-FLEX ORAL) Take by mouth. (Patient not taking: Reported on 05/25/2023)  LACTOBACILLUS ACIDOPHILUS (PROBIOTIC ORAL) Take by mouth. (Patient not taking: Reported on 05/25/2023)  mometasone (NASONEX) 50 mcg/actuation nasal spray 2 spray in each nostril daily (Patient not taking: Reported on 05/25/2023)   No current facility-administered medications on file prior to visit.   Family History  Problem Relation Age of Onset  Coronary Artery Disease (Blocked arteries around heart) Father  Myocardial Infarction (Heart attack) Father  Cancer Maternal Grandmother  Stroke Paternal Grandmother  Cancer Paternal Grandmother  Cancer Paternal Grandfather    Social History   Tobacco Use  Smoking Status Former  Types: Cigarettes  Start date: 2020  Smokeless Tobacco Never    Social History   Socioeconomic History  Marital status: Divorced  Tobacco Use  Smoking status: Former  Types: Cigarettes  Start date: 2020  Smokeless tobacco: Never  Substance and Sexual Activity  Alcohol use: No  Drug use: No   Objective:   Vitals:  BP: 138/70  Pulse: 104  Temp: 37 C (98.6 F)  SpO2: 98%  Weight: (!) 123.8 kg (273 lb)  Height: 168.9 cm (5' 6.5")  PainSc: 0-No pain  PainLoc: Breast   Body mass index is 43.4 kg/m.  Physical Exam  Vitals reviewed.  Constitutional:  General: She is not in acute distress. Appearance: Normal appearance.  HENT:  Head: Normocephalic and atraumatic.  Right Ear: External ear normal.  Left Ear: External ear normal.  Nose: Nose  normal.  Mouth/Throat:  Mouth: Mucous membranes are moist.  Pharynx: Oropharynx is clear.  Eyes:  General: No scleral icterus. Extraocular Movements: Extraocular movements intact.  Conjunctiva/sclera: Conjunctivae normal.  Pupils: Pupils are equal, round, and reactive to light.  Cardiovascular:  Rate and Rhythm: Normal rate and regular rhythm.  Pulses: Normal pulses.  Heart sounds: Normal heart sounds.  Pulmonary:  Effort: Pulmonary effort is normal. No respiratory distress.  Breath sounds: Normal breath sounds.  Abdominal:  General: Bowel sounds are normal.  Palpations: Abdomen is soft.  Tenderness: There is no abdominal tenderness.  Musculoskeletal:  General: No swelling, tenderness or deformity. Normal range of motion.  Cervical back: Normal range of motion and neck supple.  Skin: General: Skin is warm and dry.  Coloration: Skin is not jaundiced.  Neurological:  General: No focal deficit present.  Mental Status: She is alert and oriented to person, place, and time.  Psychiatric:  Mood and Affect: Mood normal.  Behavior: Behavior normal.     Breast: There is no palpable mass in either breast. There is no palpable axillary, supraclavicular, or cervical lymphadenopathy.  Labs, Imaging and Diagnostic Testing:  Assessment and Plan:   Diagnoses and all orders for this visit:  Atypical ductal hyperplasia of left breast  Pseudoangiomatous stromal hyperplasia of breast   The patient appears to have 1 small area of atypical ductal hyperplasia and 2 small areas of PASH in the upper outer quadrant of the left breast. Because of the atypical area is considered high risk and because it can look very similar to preinvasive breast cancer my recommendation would be to have this area removed. Since we are there she would like to have all 3 removed which is not unreasonable. I have discussed with her in detail the risks and benefits of the operation as well as some of the technical  aspects including the use of radioactive seeds for localization and she understands and wishes to proceed. We will plan for a left breast radioactive seed localized lumpectomy x 3

## 2023-06-24 NOTE — Op Note (Signed)
06/24/2023  11:00 AM  PATIENT:  Lori Kaiser  62 y.o. female  PRE-OPERATIVE DIAGNOSIS:  LEFT BREAST ATYPICAL DUCTAL HYPERPLASIA, PSEUDOANGIOMATOUS STROMAL HYPERPLASIA  POST-OPERATIVE DIAGNOSIS:  LEFT BREAST ATYPICAL DUCTAL HYPERPLASIA, PSEUDOANGIOMATOUS STROMAL HYPERPLASIA  PROCEDURE:  Procedure(s): LEFT BREAST RADIOACTIVE SEED LOCALIZED LUMPECTOMY x3 (Left)  SURGEON:  Surgeons and Role:    * Griselda Miner, MD - Primary  PHYSICIAN ASSISTANT:   ASSISTANTS: none   ANESTHESIA:   local and general  EBL:  minimal   BLOOD ADMINISTERED:none  DRAINS: none   LOCAL MEDICATIONS USED:  MARCAINE     SPECIMEN:  Source of Specimen:  left breast tissue; superior, lateral anterior with additional lateral margin, lateral posterior  DISPOSITION OF SPECIMEN:  PATHOLOGY  COUNTS:  YES  TOURNIQUET:  * No tourniquets in log *  DICTATION: .Dragon Dictation  After informed consent was obtained the patient was brought to the operating room and placed in the supine position on the operating table.  After adequate induction of general anesthesia the patient's left breast was prepped with ChloraPrep, allowed to dry, and draped in usual sterile manner.  An appropriate timeout was performed.  Previously 3 I-125 seeds were placed in the left breast to mark areas of atypical ductal hyperplasia and pseudoangiomatous stromal hyperplasia.  The neoprobe was set to I-125 and the 3 areas were readily identified.  Attention was first turned to the more superior area.  The area around all 3 of these were infiltrated with quarter percent Marcaine.  A curvilinear incision was then made along the upper and outer edge of the areola of the left breast with a 15 blade knife.  The incision was carried through the skin and subcutaneous tissue sharply with the electrocautery.  Dissection was then carried towards the more superior radioactive seed under the direction of the neoprobe.  Once I more closely approach the  radioactive seed I then removed a circular portion of breast tissue sharply with the electrocautery around the radioactive seed while checking the area of radioactivity frequently.  Once the tissue was removed it was oriented with the appropriate paint colors.  A specimen radiograph was obtained that showed the seed and coil clip to be within the specimen.  This was sent to pathology as the left superior lesion.  Attention was then turned to the 2 lateral lesions.  Dissection was then carried towards the more anterior of the 2 lateral lesions under the direction of the neoprobe.  Once I more closely approach the radioactive seed I then removed a circular portion of breast tissue sharply with the electrocautery around the radioactive seed while checking the area of radioactivity frequently.  Once the tissue was removed it was oriented with the appropriate paint colors.  A specimen radiograph was obtained that showed the seed but no clip.  The clip had migrated and appeared to be more lateral to the seed.  I took an additional lateral margin and marked it appropriately.  A specimen radiograph still did not see the clip.  At this point we were close to the skin and I did not feel the need to take more tissue.  Attention was then turned to the more lateral posterior lesion.  The dissection was then carried around the radioactive seed while checking the area of radioactivity frequently.  The anterior margin of this lesion corresponded to the posterior margin of the more anterior lesion.  Once the tissue was removed it was oriented with the appropriate paint colors.  A specimen  radiograph was obtained that showed the seed and X shaped clip to be within the specimen.  The specimen was then sent to pathology for further evaluation.  Hemostasis was achieved using the Bovie electrocautery.  The wound was irrigated with saline and infiltrated with more quarter percent Marcaine.  The deep layer of the incision was then closed  with layers of interrupted 3-0 Vicryl stitches.  The skin was then closed with interrupted 4-0 Monocryl subcuticular stitches.  Dermabond dressings were applied.  The patient tolerated the procedure well.  At the end of the case all needle sponge and instrument counts were correct.  The patient was then awakened and taken recovery in stable condition.  PLAN OF CARE: Discharge to home after PACU  PATIENT DISPOSITION:  PACU - hemodynamically stable.   Delay start of Pharmacological VTE agent (>24hrs) due to surgical blood loss or risk of bleeding: not applicable

## 2023-06-24 NOTE — Transfer of Care (Signed)
Immediate Anesthesia Transfer of Care Note  Patient: Lori Kaiser  Procedure(s) Performed: LEFT BREAST RADIOACTIVE SEED LOCALIZED LUMPECTOMY x3 (Left: Breast)  Patient Location: PACU  Anesthesia Type:General  Level of Consciousness: drowsy  Airway & Oxygen Therapy: Patient Spontanous Breathing and Patient connected to face mask oxygen  Post-op Assessment: Report given to RN and Post -op Vital signs reviewed and stable  Post vital signs: Reviewed and stable  Last Vitals:  Vitals Value Taken Time  BP 126/64 06/24/23 1115  Temp    Pulse 81 06/24/23 1117  Resp 15 06/24/23 1117  SpO2 98 % 06/24/23 1117  Vitals shown include unfiled device data.  Last Pain:  Vitals:   06/24/23 0910  TempSrc: Oral  PainSc: 0-No pain         Complications: No notable events documented.

## 2023-06-24 NOTE — Interval H&P Note (Signed)
History and Physical Interval Note:  06/24/2023 9:25 AM  Lori Kaiser  has presented today for surgery, with the diagnosis of LEFT BREAST ATYPICAL DUCTAL HYPERPLASIA, PSEUDOANGIOMATOUS STROMAL HYPERPLASIA.  The various methods of treatment have been discussed with the patient and family. After consideration of risks, benefits and other options for treatment, the patient has consented to  Procedure(s): LEFT BREAST RADIOACTIVE SEED LOCALIZED LUMPECTOMY x3 (Left) as a surgical intervention.  The patient's history has been reviewed, patient examined, no change in status, stable for surgery.  I have reviewed the patient's chart and labs.  Questions were answered to the patient's satisfaction.     Chevis Pretty III

## 2023-06-24 NOTE — Anesthesia Postprocedure Evaluation (Signed)
Anesthesia Post Note  Patient: Lori Kaiser  Procedure(s) Performed: LEFT BREAST RADIOACTIVE SEED LOCALIZED LUMPECTOMY x3 (Left: Breast)     Patient location during evaluation: PACU Anesthesia Type: General Level of consciousness: awake and alert Pain management: pain level controlled Vital Signs Assessment: post-procedure vital signs reviewed and stable Respiratory status: spontaneous breathing, nonlabored ventilation, respiratory function stable and patient connected to nasal cannula oxygen Cardiovascular status: blood pressure returned to baseline and stable Postop Assessment: no apparent nausea or vomiting Anesthetic complications: no   No notable events documented.  Last Vitals:  Vitals:   06/24/23 1145 06/24/23 1201  BP: (!) 140/65 (!) 145/67  Pulse: 71 69  Resp: 13 16  Temp:  36.8 C  SpO2: 95% 95%    Last Pain:  Vitals:   06/24/23 1201  TempSrc:   PainSc: 2                  Nelle Don Bonnie Roig

## 2023-06-24 NOTE — Anesthesia Preprocedure Evaluation (Addendum)
Anesthesia Evaluation  Patient identified by MRN, date of birth, ID band Patient awake    Reviewed: Allergy & Precautions, NPO status , Patient's Chart, lab work & pertinent test results  Airway Mallampati: II  TM Distance: >3 FB Neck ROM: Full    Dental no notable dental hx.    Pulmonary neg pulmonary ROS, former smoker   Pulmonary exam normal        Cardiovascular negative cardio ROS  Rhythm:Regular Rate:Normal     Neuro/Psych  Headaches  Anxiety Depression       GI/Hepatic Neg liver ROS,GERD  ,,  Endo/Other  negative endocrine ROS    Renal/GU negative Renal ROS  negative genitourinary   Musculoskeletal Left breast disease   Abdominal Normal abdominal exam  (+)   Peds  Hematology  (+) Blood dyscrasia, anemia Lab Results      Component                Value               Date                      WBC                      8.2                 12/18/2022                HGB                      13.8                12/18/2022                HCT                      41.1                12/18/2022                MCV                      88.3                12/18/2022                PLT                      298.0               12/18/2022              Anesthesia Other Findings   Reproductive/Obstetrics                             Anesthesia Physical Anesthesia Plan  ASA: 2  Anesthesia Plan: General   Post-op Pain Management: Celebrex PO (pre-op)* and Tylenol PO (pre-op)*   Induction: Intravenous  PONV Risk Score and Plan: 3 and Ondansetron, Dexamethasone, Midazolam and Treatment may vary due to age or medical condition  Airway Management Planned: Mask and LMA  Additional Equipment: None  Intra-op Plan:   Post-operative Plan: Extubation in OR  Informed Consent: I have reviewed the patients History and Physical, chart, labs and discussed the procedure including the risks, benefits and  alternatives for the proposed  anesthesia with the patient or authorized representative who has indicated his/her understanding and acceptance.     Dental advisory given  Plan Discussed with: CRNA  Anesthesia Plan Comments:        Anesthesia Quick Evaluation

## 2023-06-24 NOTE — Discharge Instructions (Signed)
  Post Anesthesia Home Care Instructions  Activity: Get plenty of rest for the remainder of the day. A responsible individual must stay with you for 24 hours following the procedure.  For the next 24 hours, DO NOT: -Drive a car -Advertising copywriter -Drink alcoholic beverages -Take any medication unless instructed by your physician -Make any legal decisions or sign important papers.  Meals: Start with liquid foods such as gelatin or soup. Progress to regular foods as tolerated. Avoid greasy, spicy, heavy foods. If nausea and/or vomiting occur, drink only clear liquids until the nausea and/or vomiting subsides. Call your physician if vomiting continues.  Special Instructions/Symptoms: Your throat may feel dry or sore from the anesthesia or the breathing tube placed in your throat during surgery. If this causes discomfort, gargle with warm salt water. The discomfort should disappear within 24 hours.  If you had a scopolamine patch placed behind your ear for the management of post- operative nausea and/or vomiting:  1. The medication in the patch is effective for 72 hours, after which it should be removed.  Wrap patch in a tissue and discard in the trash. Wash hands thoroughly with soap and water. 2. You may remove the patch earlier than 72 hours if you experience unpleasant side effects which may include dry mouth, dizziness or visual disturbances. 3. Avoid touching the patch. Wash your hands with soap and water after contact with the patch.     Tylenol last given at 920am

## 2023-06-25 ENCOUNTER — Encounter (HOSPITAL_BASED_OUTPATIENT_CLINIC_OR_DEPARTMENT_OTHER): Payer: Self-pay | Admitting: General Surgery

## 2023-06-25 LAB — SURGICAL PATHOLOGY

## 2023-07-20 DIAGNOSIS — H353112 Nonexudative age-related macular degeneration, right eye, intermediate dry stage: Secondary | ICD-10-CM | POA: Diagnosis not present

## 2023-07-20 DIAGNOSIS — H2513 Age-related nuclear cataract, bilateral: Secondary | ICD-10-CM | POA: Diagnosis not present

## 2023-07-20 DIAGNOSIS — H353122 Nonexudative age-related macular degeneration, left eye, intermediate dry stage: Secondary | ICD-10-CM | POA: Diagnosis not present

## 2023-07-20 DIAGNOSIS — H35372 Puckering of macula, left eye: Secondary | ICD-10-CM | POA: Diagnosis not present

## 2023-08-03 ENCOUNTER — Inpatient Hospital Stay: Payer: Federal, State, Local not specified - PPO

## 2023-08-03 ENCOUNTER — Inpatient Hospital Stay
Payer: Federal, State, Local not specified - PPO | Attending: Hematology and Oncology | Admitting: Hematology and Oncology

## 2023-08-03 VITALS — BP 149/69 | HR 91 | Temp 98.6°F | Resp 15 | Wt 279.5 lb

## 2023-08-03 DIAGNOSIS — Z87891 Personal history of nicotine dependence: Secondary | ICD-10-CM | POA: Insufficient documentation

## 2023-08-03 DIAGNOSIS — N6092 Unspecified benign mammary dysplasia of left breast: Secondary | ICD-10-CM | POA: Insufficient documentation

## 2023-08-03 DIAGNOSIS — Z8051 Family history of malignant neoplasm of kidney: Secondary | ICD-10-CM | POA: Insufficient documentation

## 2023-08-03 DIAGNOSIS — Z801 Family history of malignant neoplasm of trachea, bronchus and lung: Secondary | ICD-10-CM | POA: Insufficient documentation

## 2023-08-03 NOTE — Progress Notes (Signed)
[facility] CONSULT NOTE  Patient Care Team: Doreene Nest, NP as PCP - General (Internal Medicine) Olivia Mackie, MD as Consulting Physician (Obstetrics and Gynecology)  CHIEF COMPLAINTS/PURPOSE OF CONSULTATION:  Newly diagnosed breast cancer  HISTORY OF PRESENTING ILLNESS:  Lori Kaiser 62 y.o. female is here because of recent diagnosis of left breast ADH  The patient, with a recent history of atypical lobular hyperplasia, underwent a lumpectomy. This is her first breast surgery. She reports no family history of breast cancer but mentions her mother had a similar condition twenty years ago that was not removed and never turned malignant. She has been researching tamoxifen, a medication suggested to reduce her risk, but expresses hesitance about starting a new medication. She also mentions having lesions in her brain and a lump on her hip, both of which have been investigated with MRIs in the past. The patient is postmenopausal and has struggled with weight gain since quitting smoking 24 years ago. She recently lost 47 pounds through diet and exercise but has since regained the weight. She is otherwise retired.   MEDICAL HISTORY:  Past Medical History:  Diagnosis Date   allergies    Allergy    Anemia    Anxiety    Blood transfusion without reported diagnosis    Chronic headaches    Depression    Eating disorder    Fatigue    Generalized abdominal pain 01/18/2020   GERD (gastroesophageal reflux disease)    History of chicken pox    Irritable bowel syndrome (IBS)    Migraine    Urine incontinence     SURGICAL HISTORY: Past Surgical History:  Procedure Laterality Date   BREAST BIOPSY Left 04/20/2023   Stereo bx, x-clip, path pending   BREAST BIOPSY Left 04/20/2023   stereo bx, coil clip, path pending   BREAST BIOPSY Left 04/20/2023   stereo bx, ribbon clip, path pending   BREAST BIOPSY Left 04/20/2023   MM LT BREAST BX W LOC DEV 1ST LESION IMAGE BX  SPEC STEREO GUIDE 04/20/2023 [facility]   BREAST BIOPSY Left 04/20/2023   MM LT BREAST BX W LOC DEV EA AD LESION IMG BX SPEC STEREO GUIDE 04/20/2023 [facility]   BREAST BIOPSY Left 04/20/2023   MM LT BREAST BX W LOC DEV EA AD LESION IMG BX SPEC STEREO GUIDE 04/20/2023 [facility]   BREAST BIOPSY  06/22/2023   MM LT RADIOACTIVE SEED EA ADD LESION LOC MAMMO GUIDE 06/22/2023 [facility]   BREAST BIOPSY  06/22/2023   MM LT RADIOACTIVE SEED EA ADD LESION LOC MAMMO GUIDE 06/22/2023 [facility]   BREAST BIOPSY  06/22/2023   MM LT RADIOACTIVE SEED LOC MAMMO GUIDE 06/22/2023 [facility]   BREAST LUMPECTOMY WITH RADIOACTIVE SEED LOCALIZATION Left 06/24/2023   Procedure: LEFT BREAST RADIOACTIVE SEED LOCALIZED LUMPECTOMY x3;  Surgeon: Griselda Miner, MD;  Location: [facility];  Service: General;  Laterality: Left;   CESAREAN SECTION  10/12/1994   CHOLECYSTECTOMY N/A 01/21/2019   Procedure: LAPAROSCOPIC CHOLECYSTECTOMY -No grams;  Surgeon: Leafy Ro, MD;  Location: [facility];  Service: General;  Laterality: N/A;   Dental Implant  10/13/2011   DILATION AND CURETTAGE OF UTERUS     x2 Miscarriages 1994 & 1995    SOCIAL HISTORY: Social History   Socioeconomic History   Marital status: Divorced    Spouse name: Not on file   Number of children: 2   Years of education: Not on file   Highest  education level: Not on file  Occupational History   Occupation: Occupational hygienist: [employer]  Tobacco Use   Smoking status: Former    Current packs/day: 1.00    Average packs/day: 1 pack/day for 12.0 years (12.0 ttl pk-yrs)    Types: Cigarettes   Smokeless tobacco: Never  Substance and Sexual Activity   Alcohol use: No   Drug use: No   Sexual activity: Never  Other Topics Concern   Not on file  Social History Narrative   Divorced, twins age 34   Diet: moderate, water   Exercise: walks sporadically   Social Determinants of Health    Financial Resource Strain: Not on file  Food Insecurity: Not on file  Transportation Needs: Not on file  Physical Activity: Not on file  Stress: Not on file  Social Connections: Not on file  Intimate Partner Violence: Not on file    FAMILY HISTORY: Family History  Problem Relation Age of Onset   Heart disease Father 11   Atrial fibrillation Father    Heart disease Paternal Aunt    Kidney cancer Maternal Grandmother    Lung cancer Maternal Grandfather    Heart disease Paternal Grandmother    Lung cancer Paternal Grandmother    Stroke Paternal Grandmother    Lung cancer Paternal Grandfather    Breast cancer Neg Hx     ALLERGIES:  is allergic to zithromax [azithromycin] and penicillins.  MEDICATIONS:  Current Outpatient Medications  Medication Sig Dispense Refill   famotidine (PEPCID) 20 MG tablet Take 20 mg by mouth 2 (two) times daily.     fluticasone (FLONASE) 50 MCG/ACT nasal spray Place 2 sprays into both nostrils daily as needed for allergies or rhinitis.     loratadine (CLARITIN) 10 MG tablet Take 10 mg by mouth daily as needed for allergies.     Multiple Vitamin (MULTIVITAMIN) tablet Take 1 tablet by mouth daily.     oxyCODONE (ROXICODONE) 5 MG immediate release tablet Take 1 tablet (5 mg total) by mouth every 6 (six) hours as needed for severe pain. 15 tablet 0   No current facility-administered medications for this visit.    REVIEW OF SYSTEMS:   Constitutional: Denies fevers, chills or abnormal night sweats Eyes: Denies blurriness of vision, double vision or watery eyes Ears, nose, mouth, throat, and face: Denies mucositis or sore throat Respiratory: Denies cough, dyspnea or wheezes Cardiovascular: Denies palpitation, chest discomfort or lower extremity swelling Gastrointestinal:  Denies nausea, heartburn or change in bowel habits Skin: Denies abnormal skin rashes Lymphatics: Denies new lymphadenopathy or easy bruising Neurological:Denies numbness, tingling  or new weaknesses Behavioral/Psych: Mood is stable, no new changes  Breast: Denies any palpable lumps or discharge All other systems were reviewed with the patient and are negative.  PHYSICAL EXAMINATION: ECOG PERFORMANCE STATUS: 0 - Asymptomatic  Vitals:   08/03/23 1010  BP: (!) 149/69  Pulse: 91  Resp: 15  Temp: 98.6 F (37 C)  SpO2: 97%   Filed Weights   08/03/23 1010  Weight: 279 lb 8 oz (126.8 kg)    GENERAL:alert, no distress and comfortable Breast: Large breasts noted. Bilateral breasts inspected and palpated. No palpable masses or regional adenopathy. No LE edema.  LABORATORY DATA:  I have reviewed the data as listed Lab Results  Component Value Date   WBC 8.2 12/18/2022   HGB 13.8 12/18/2022   HCT 41.1 12/18/2022   MCV 88.3 12/18/2022   PLT 298.0 12/18/2022   Lab  Results  Component Value Date   NA 139 12/18/2022   K 4.0 12/18/2022   CL 101 12/18/2022   CO2 29 12/18/2022    RADIOGRAPHIC STUDIES: I have personally reviewed the radiological reports and agreed with the findings in the report.  ASSESSMENT AND PLAN:   Pathology review: I discussed the difference between atypical ductal hyperplasia, DCIS and invasive breast cancer. I explained to her that atypical ductal hyperplasia  is characterized by a proliferation of uniform epithelial cells filling part, but not the entirety, of the involved duct. ADH is associated with an increased risk of both ipsilateral and contralateral breast cancer and thus provides evidence of underlying breast abnormalities that predispose to breast cancer.   Prognosis:Using the TC model calculated, life time risk of BC is about 29.2%  Atypical Lobular Hyperplasia Post-lumpectomy with no malignancy detected. Discussed the increased risk of breast cancer and the potential benefits of tamoxifen for risk reduction. Patient initially resistant to medication but open to further consideration. -Print information about tamoxifen for  patient to review. -Follow-up call in a few weeks to discuss patient's decision regarding tamoxifen.  Breast Cancer Risk Lifetime risk calculated at 29.2%, qualifying patient for MRI screening in addition to mammograms. Discussed the benefits and potential drawbacks of MRI screening. -Schedule MRI for December 2024. -Continue annual mammograms. -Discussed unknown long-term risk of gadolinium deposition and increased sensitivity with MRIs leading to additional biopsies at times.  Weight Management Patient expressed difficulty with weight loss. Discussed the potential benefits of a medical weight loss clinic. -Refer to medical weight loss clinic for further support and guidance.  General Health Maintenance -Continue self-breast exams monthly. -Continue regular physical exams with primary care provider. -Schedule follow-up appointment in October 2025.   All questions were answered. The patient knows to call the clinic with any problems, questions or concerns.    Rachel Moulds, MD 08/03/23

## 2023-08-04 ENCOUNTER — Telehealth: Payer: Self-pay | Admitting: Hematology and Oncology

## 2023-08-04 NOTE — Telephone Encounter (Signed)
 Left patient a vm regarding upcoming appointment

## 2023-08-25 ENCOUNTER — Inpatient Hospital Stay
Payer: Federal, State, Local not specified - PPO | Attending: Hematology and Oncology | Admitting: Hematology and Oncology

## 2023-08-25 ENCOUNTER — Other Ambulatory Visit: Payer: Self-pay | Admitting: *Deleted

## 2023-08-25 DIAGNOSIS — Z9189 Other specified personal risk factors, not elsewhere classified: Secondary | ICD-10-CM | POA: Diagnosis not present

## 2023-08-25 NOTE — Progress Notes (Signed)
[facility] CONSULT NOTE  Patient Care Team: Doreene Nest, NP as PCP - General (Internal Medicine) Olivia Mackie, MD as Consulting Physician (Obstetrics and Gynecology)  CHIEF COMPLAINTS/PURPOSE OF CONSULTATION:  Newly diagnosed breast cancer  HISTORY OF PRESENTING ILLNESS:  Lori Kaiser 62 y.o. female is here because of recent diagnosis of left breast ADH  Patient is here for telephone follow-up since her diagnosis of atypical lobular hyperplasia and noted increased risk of developing breast cancer.  She tells me that she has thought about tamoxifen and she would not want to try it at this time. She is however interested in proceeding with intensive screening  MEDICAL HISTORY:  Past Medical History:  Diagnosis Date   allergies    Allergy    Anemia    Anxiety    Blood transfusion without reported diagnosis    Chronic headaches    Depression    Eating disorder    Fatigue    Generalized abdominal pain 01/18/2020   GERD (gastroesophageal reflux disease)    History of chicken pox    Irritable bowel syndrome (IBS)    Migraine    Urine incontinence     SURGICAL HISTORY: Past Surgical History:  Procedure Laterality Date   BREAST BIOPSY Left 04/20/2023   Stereo bx, x-clip, path pending   BREAST BIOPSY Left 04/20/2023   stereo bx, coil clip, path pending   BREAST BIOPSY Left 04/20/2023   stereo bx, ribbon clip, path pending   BREAST BIOPSY Left 04/20/2023   MM LT BREAST BX W LOC DEV 1ST LESION IMAGE BX SPEC STEREO GUIDE 04/20/2023 [facility]   BREAST BIOPSY Left 04/20/2023   MM LT BREAST BX W LOC DEV EA AD LESION IMG BX SPEC STEREO GUIDE 04/20/2023 [facility]   BREAST BIOPSY Left 04/20/2023   MM LT BREAST BX W LOC DEV EA AD LESION IMG BX SPEC STEREO GUIDE 04/20/2023 [facility]   BREAST BIOPSY  06/22/2023   MM LT RADIOACTIVE SEED EA ADD LESION LOC MAMMO GUIDE 06/22/2023 [facility]   BREAST BIOPSY  06/22/2023   MM LT RADIOACTIVE SEED EA  ADD LESION LOC MAMMO GUIDE 06/22/2023 [facility]   BREAST BIOPSY  06/22/2023   MM LT RADIOACTIVE SEED LOC MAMMO GUIDE 06/22/2023 [facility]   BREAST LUMPECTOMY WITH RADIOACTIVE SEED LOCALIZATION Left 06/24/2023   Procedure: LEFT BREAST RADIOACTIVE SEED LOCALIZED LUMPECTOMY x3;  Surgeon: Griselda Miner, MD;  Location: [facility];  Service: General;  Laterality: Left;   CESAREAN SECTION  10/12/1994   CHOLECYSTECTOMY N/A 01/21/2019   Procedure: LAPAROSCOPIC CHOLECYSTECTOMY -No grams;  Surgeon: Leafy Ro, MD;  Location: [facility];  Service: General;  Laterality: N/A;   Dental Implant  10/13/2011   DILATION AND CURETTAGE OF UTERUS     x2 Miscarriages 1994 & 1995    SOCIAL HISTORY: Social History   Socioeconomic History   Marital status: Divorced    Spouse name: Not on file   Number of children: 2   Years of education: Not on file   Highest education level: Not on file  Occupational History   Occupation: Insurance account manager and Program Analyst    Employer: [employer]  Tobacco Use   Smoking status: Former    Current packs/day: 1.00    Average packs/day: 1 pack/day for 12.0 years (12.0 ttl pk-yrs)    Types: Cigarettes   Smokeless tobacco: Never  Substance and Sexual Activity   Alcohol use: No   Drug use: No   Sexual activity:  Never  Other Topics Concern   Not on file  Social History Narrative   Divorced, twins age 64   Diet: moderate, water   Exercise: walks sporadically   Social Determinants of Health   Financial Resource Strain: Not on file  Food Insecurity: Not on file  Transportation Needs: Not on file  Physical Activity: Not on file  Stress: Not on file  Social Connections: Not on file  Intimate Partner Violence: Not on file    FAMILY HISTORY: Family History  Problem Relation Age of Onset   Heart disease Father 16   Atrial fibrillation Father    Heart disease Paternal Aunt    Kidney cancer Maternal Grandmother    Lung cancer Maternal Grandfather     Heart disease Paternal Grandmother    Lung cancer Paternal Grandmother    Stroke Paternal Grandmother    Lung cancer Paternal Grandfather    Breast cancer Neg Hx     ALLERGIES:  is allergic to zithromax [azithromycin] and penicillins.  MEDICATIONS:  Current Outpatient Medications  Medication Sig Dispense Refill   famotidine (PEPCID) 20 MG tablet Take 20 mg by mouth 2 (two) times daily.     fluticasone (FLONASE) 50 MCG/ACT nasal spray Place 2 sprays into both nostrils daily as needed for allergies or rhinitis.     loratadine (CLARITIN) 10 MG tablet Take 10 mg by mouth daily as needed for allergies.     Multiple Vitamin (MULTIVITAMIN) tablet Take 1 tablet by mouth daily.     oxyCODONE (ROXICODONE) 5 MG immediate release tablet Take 1 tablet (5 mg total) by mouth every 6 (six) hours as needed for severe pain. 15 tablet 0   No current facility-administered medications for this visit.    REVIEW OF SYSTEMS:   Constitutional: Denies fevers, chills or abnormal night sweats Eyes: Denies blurriness of vision, double vision or watery eyes Ears, nose, mouth, throat, and face: Denies mucositis or sore throat Respiratory: Denies cough, dyspnea or wheezes Cardiovascular: Denies palpitation, chest discomfort or lower extremity swelling Gastrointestinal:  Denies nausea, heartburn or change in bowel habits Skin: Denies abnormal skin rashes Lymphatics: Denies new lymphadenopathy or easy bruising Neurological:Denies numbness, tingling or new weaknesses Behavioral/Psych: Mood is stable, no new changes  Breast: Denies any palpable lumps or discharge All other systems were reviewed with the patient and are negative.  PHYSICAL EXAMINATION: ECOG PERFORMANCE STATUS: 0 - Asymptomatic  There were no vitals filed for this visit.  There were no vitals filed for this visit.  Physical exam deferred, telephone visit  LABORATORY DATA:  I have reviewed the data as listed Lab Results  Component Value  Date   WBC 8.2 12/18/2022   HGB 13.8 12/18/2022   HCT 41.1 12/18/2022   MCV 88.3 12/18/2022   PLT 298.0 12/18/2022   Lab Results  Component Value Date   NA 139 12/18/2022   K 4.0 12/18/2022   CL 101 12/18/2022   CO2 29 12/18/2022    RADIOGRAPHIC STUDIES: I have personally reviewed the radiological reports and agreed with the findings in the report.  ASSESSMENT AND PLAN:   Pathology review: I discussed the difference between atypical ductal hyperplasia, DCIS and invasive breast cancer. I explained to her that atypical ductal hyperplasia  is characterized by a proliferation of uniform epithelial cells filling part, but not the entirety, of the involved duct. ADH is associated with an increased risk of both ipsilateral and contralateral breast cancer and thus provides evidence of underlying breast abnormalities that predispose to  breast cancer.   Prognosis:Using the TC model calculated, life time risk of BC is about 29.2%  Atypical Lobular Hyperplasia Post-lumpectomy with no malignancy detected. Discussed the increased risk of breast cancer and the potential benefits of tamoxifen for risk reduction.  This is a follow up telephone call to see if she wants to consider tamoxifen.  At this time she does not want to consider tamoxifen, she wants to continue intensive screening.  Breast Cancer Risk Lifetime risk calculated at 29.2%, qualifying patient for MRI screening in addition to mammograms. Discussed the benefits and potential drawbacks of MRI screening. -Schedule MRI for December 2024. -Continue annual mammograms. -Discussed unknown long-term risk of gadolinium deposition and increased sensitivity with MRIs leading to additional biopsies at times. -Patient is willing to proceed with MRIs alternating with mammograms.   General Health Maintenance -Continue self-breast exams monthly. -Continue regular physical exams with primary care provider. -Schedule follow-up appointment in 1  yr  I connected with  Valora Piccolo on 08/25/23 by a telephone application and verified that I am speaking with the correct person using two identifiers.   I discussed the limitations of evaluation and management by telemedicine. The patient expressed understanding and agreed to proceed.  All questions were answered. The patient knows to call the clinic with any problems, questions or concerns.    Rachel Moulds, MD 08/25/23

## 2023-08-27 ENCOUNTER — Other Ambulatory Visit: Payer: Self-pay | Admitting: *Deleted

## 2023-08-31 ENCOUNTER — Encounter (INDEPENDENT_AMBULATORY_CARE_PROVIDER_SITE_OTHER): Payer: Federal, State, Local not specified - PPO | Admitting: Physician Assistant

## 2023-10-22 ENCOUNTER — Other Ambulatory Visit: Payer: Federal, State, Local not specified - PPO

## 2023-11-24 ENCOUNTER — Other Ambulatory Visit: Payer: Federal, State, Local not specified - PPO

## 2023-12-28 ENCOUNTER — Other Ambulatory Visit: Payer: Federal, State, Local not specified - PPO

## 2024-01-23 ENCOUNTER — Ambulatory Visit
Admission: RE | Admit: 2024-01-23 | Discharge: 2024-01-23 | Disposition: A | Discharge status: Home / Self Care | Source: Ambulatory Visit | Attending: Hematology and Oncology | Admitting: Hematology and Oncology

## 2024-01-23 DIAGNOSIS — N641 Fat necrosis of breast: Secondary | ICD-10-CM | POA: Diagnosis not present

## 2024-01-23 DIAGNOSIS — Z1239 Encounter for other screening for malignant neoplasm of breast: Secondary | ICD-10-CM | POA: Diagnosis not present

## 2024-01-23 DIAGNOSIS — E66813 Obesity, class 3: Secondary | ICD-10-CM

## 2024-01-23 DIAGNOSIS — Z803 Family history of malignant neoplasm of breast: Secondary | ICD-10-CM | POA: Diagnosis not present

## 2024-01-23 MED ORDER — GADOPICLENOL 0.5 MMOL/ML IV SOLN
10.0000 mL | Freq: Once | INTRAVENOUS | Status: AC | PRN
  Administered 2024-01-23: 10 mL via INTRAVENOUS

## 2024-01-24 ENCOUNTER — Other Ambulatory Visit: Payer: Self-pay | Admitting: Hematology and Oncology

## 2024-01-24 NOTE — Progress Notes (Signed)
 I called Lori Kaiser with MRI results. Recommended biopsy as discussed in MRI results She is not sure about the biopsy, she wants to think about it. She understands there is a possibility, this finding could be malignant. She mentioned she would call us  back if she would decide to proceed with MRI biopsy.  .sig

## 2024-01-26 ENCOUNTER — Other Ambulatory Visit: Payer: Self-pay

## 2024-01-26 ENCOUNTER — Encounter: Payer: Self-pay | Admitting: Hematology and Oncology

## 2024-01-26 DIAGNOSIS — Z9189 Other specified personal risk factors, not elsewhere classified: Secondary | ICD-10-CM

## 2024-01-27 ENCOUNTER — Other Ambulatory Visit: Payer: Self-pay | Admitting: Hematology and Oncology

## 2024-01-27 ENCOUNTER — Other Ambulatory Visit: Payer: Self-pay

## 2024-01-27 DIAGNOSIS — Z9189 Other specified personal risk factors, not elsewhere classified: Secondary | ICD-10-CM

## 2024-02-02 ENCOUNTER — Ambulatory Visit: Admission: RE | Admit: 2024-02-02 | Source: Ambulatory Visit

## 2024-02-02 ENCOUNTER — Other Ambulatory Visit: Payer: Self-pay | Admitting: Hematology and Oncology

## 2024-02-02 ENCOUNTER — Ambulatory Visit
Admission: RE | Admit: 2024-02-02 | Discharge: 2024-02-02 | Disposition: A | Discharge status: Home / Self Care | Source: Ambulatory Visit | Attending: Hematology and Oncology | Admitting: Hematology and Oncology

## 2024-02-02 DIAGNOSIS — R928 Other abnormal and inconclusive findings on diagnostic imaging of breast: Secondary | ICD-10-CM

## 2024-02-02 DIAGNOSIS — Z9189 Other specified personal risk factors, not elsewhere classified: Secondary | ICD-10-CM

## 2024-02-02 DIAGNOSIS — N6320 Unspecified lump in the left breast, unspecified quadrant: Secondary | ICD-10-CM | POA: Diagnosis not present

## 2024-02-02 MED ORDER — GADOPICLENOL 0.5 MMOL/ML IV SOLN
10.0000 mL | Freq: Once | INTRAVENOUS | Status: AC | PRN
  Administered 2024-02-02: 10 mL via INTRAVENOUS

## 2024-02-02 NOTE — Progress Notes (Signed)
 6 month follow up MRI breast ordered as recommended by radiology.

## 2024-02-11 ENCOUNTER — Telehealth: Payer: Self-pay

## 2024-02-11 NOTE — Telephone Encounter (Signed)
 Spoke with pt and she is in agreement with this plan. She is scheduled for 08/22/24 and knows to call for a sooner appt if she notices any changes.

## 2024-02-11 NOTE — Telephone Encounter (Signed)
-----   Message from Palmarejo Iruku sent at 02/02/2024  6:53 PM EDT ----- Please let the pt know about the breast MRI, radiologist feels this is more than likely benign changes, they recommend follow up MRI in 6 months. This has been ordered

## 2024-04-04 DIAGNOSIS — Z124 Encounter for screening for malignant neoplasm of cervix: Secondary | ICD-10-CM | POA: Diagnosis not present

## 2024-04-04 DIAGNOSIS — Z1331 Encounter for screening for depression: Secondary | ICD-10-CM | POA: Diagnosis not present

## 2024-04-04 DIAGNOSIS — Z01411 Encounter for gynecological examination (general) (routine) with abnormal findings: Secondary | ICD-10-CM | POA: Diagnosis not present

## 2024-04-04 DIAGNOSIS — N950 Postmenopausal bleeding: Secondary | ICD-10-CM | POA: Diagnosis not present

## 2024-04-18 DIAGNOSIS — N950 Postmenopausal bleeding: Secondary | ICD-10-CM | POA: Diagnosis not present

## 2024-04-18 DIAGNOSIS — R9389 Abnormal findings on diagnostic imaging of other specified body structures: Secondary | ICD-10-CM | POA: Diagnosis not present

## 2024-04-24 DIAGNOSIS — N950 Postmenopausal bleeding: Secondary | ICD-10-CM | POA: Diagnosis not present

## 2024-04-27 ENCOUNTER — Telehealth: Payer: Self-pay

## 2024-04-27 NOTE — Telephone Encounter (Signed)
 Spoke with the patient regarding the referral to GYN oncology. Patient scheduled as new patient with Dr Eldonna on 05/01/2024. Patient given an arrival time of 10:00am.  Explained to the patient the the doctor will perform a pelvic exam at this visit. Patient given the policy that only one visitor allowed and that visitor must be over 16 yrs are allowed in the Cancer Center. Patient given the address/phone number for the clinic and that the center offers free valet service. Patient aware that masks required.

## 2024-04-27 NOTE — Telephone Encounter (Signed)
 Left message for patient to call back and schedule new patient appointment on Monday 7/21 with Dr Eldonna.

## 2024-04-28 ENCOUNTER — Encounter: Payer: Self-pay | Admitting: Psychiatry

## 2024-05-01 ENCOUNTER — Encounter: Payer: Self-pay | Admitting: Psychiatry

## 2024-05-01 ENCOUNTER — Inpatient Hospital Stay: Attending: Psychiatry | Admitting: Psychiatry

## 2024-05-01 VITALS — BP 123/61 | HR 100 | Temp 98.1°F | Resp 19 | Ht 67.0 in | Wt 246.8 lb

## 2024-05-01 DIAGNOSIS — N950 Postmenopausal bleeding: Secondary | ICD-10-CM | POA: Diagnosis present

## 2024-05-01 DIAGNOSIS — Z6838 Body mass index (BMI) 38.0-38.9, adult: Secondary | ICD-10-CM | POA: Insufficient documentation

## 2024-05-01 DIAGNOSIS — F419 Anxiety disorder, unspecified: Secondary | ICD-10-CM | POA: Insufficient documentation

## 2024-05-01 DIAGNOSIS — Z801 Family history of malignant neoplasm of trachea, bronchus and lung: Secondary | ICD-10-CM | POA: Diagnosis not present

## 2024-05-01 DIAGNOSIS — Z8042 Family history of malignant neoplasm of prostate: Secondary | ICD-10-CM | POA: Diagnosis not present

## 2024-05-01 DIAGNOSIS — K219 Gastro-esophageal reflux disease without esophagitis: Secondary | ICD-10-CM | POA: Insufficient documentation

## 2024-05-01 DIAGNOSIS — N858 Other specified noninflammatory disorders of uterus: Secondary | ICD-10-CM | POA: Diagnosis not present

## 2024-05-01 DIAGNOSIS — R140 Abdominal distension (gaseous): Secondary | ICD-10-CM

## 2024-05-01 DIAGNOSIS — F32A Depression, unspecified: Secondary | ICD-10-CM | POA: Insufficient documentation

## 2024-05-01 DIAGNOSIS — Z8051 Family history of malignant neoplasm of kidney: Secondary | ICD-10-CM | POA: Insufficient documentation

## 2024-05-01 DIAGNOSIS — Z79899 Other long term (current) drug therapy: Secondary | ICD-10-CM | POA: Diagnosis not present

## 2024-05-01 DIAGNOSIS — K580 Irritable bowel syndrome with diarrhea: Secondary | ICD-10-CM | POA: Insufficient documentation

## 2024-05-01 DIAGNOSIS — C541 Malignant neoplasm of endometrium: Secondary | ICD-10-CM | POA: Diagnosis not present

## 2024-05-01 DIAGNOSIS — R9389 Abnormal findings on diagnostic imaging of other specified body structures: Secondary | ICD-10-CM | POA: Diagnosis present

## 2024-05-01 DIAGNOSIS — Z87891 Personal history of nicotine dependence: Secondary | ICD-10-CM | POA: Diagnosis not present

## 2024-05-01 DIAGNOSIS — N854 Malposition of uterus: Secondary | ICD-10-CM | POA: Diagnosis not present

## 2024-05-01 NOTE — Patient Instructions (Addendum)
 Plan on having a CT scan of the abdomen and pelvis. Nothing to eat or drink 4 hours before the scan and arrive 2 hours before to begin drinking oral contrast.  Today Dr. Eldonna performed an endometrial biopsy. You may have bleeding after this and passage of tissue.  Plan on follow up in the office on July 28 to discuss results and recommendations moving forward.  Please call the office for any needs.

## 2024-05-01 NOTE — Progress Notes (Signed)
 GYNECOLOGIC ONCOLOGY NEW PATIENT CONSULTATION  Date of Service: 05/01/2024 Referring Provider: Burnard Bowers, MD   ASSESSMENT AND PLAN: Lori Kaiser is a 63 y.o. woman with postmenopausal bleeding and thickened endometrium, unable to sample uterus at referring office.  Reviewed history to date.  Recommend sampling.  Reviewed options for sampling in office with cervical block versus OR.  Patient amenable to attempt at sampling in office with cervical block.  Successful endometrial biopsy performed today with abundant tissue.  Reviewed with patient possible etiologies of postmenopausal bleeding and thickened endometrium.  At this time, based on abundant tissue removed, did review concern for possible endometrial cancer but will defer to final pathology results.  Will go ahead and get patient ordered for CT abdomen pelvis given some of her abdominal symptoms and duration of bleeding and symptoms.  Reviewed that we may need to add a CT chest pending what the results of the biopsy are.  Will have patient return in 1 week to review biopsy results and make final treatment plan.  A copy of this note was sent to the patient's referring provider.  Hoy Masters, MD Gynecologic Oncology   Medical Decision Making I personally spent  TOTAL 60 minutes face-to-face and non-face-to-face in the care of this patient, which includes all pre, intra, and post visit time on the date of service.   ------------  CC: Postmenopausal bleeding, thickened endometrium  HISTORY OF PRESENT ILLNESS:  Lori Kaiser is a 62 y.o. woman who is seen in consultation at the request of Burnard Bowers, MD for evaluation of postmenopausal bleeding and thickened endometrium.  Patient presented to OB/GYN on 04/04/2024 for new bleeding over the last 6 months.  She reported that she had an evaluation for irregular bleeding in her mid 54s and used combined OCPs for 3 months which regulated her bleeding.  She then reports  menopause at age 34 with no postmenopausal HRT.  She also reports a history of abnormal Pap smears in her 38s without a history of cervical excision but her last Pap smear greater than 5 years ago at the time of her visit.  Pap smear returned with NILM, HPV high-risk negative.  She returned on 04/18/2024 for pelvic ultrasound which noted a retroverted uterus measuring 9.8 x 5.6 x 4.8 cm with a thickened endometrium of 28 mm and internal hypervascularity.  On 04/24/2024 she returns for an endometrial biopsy.  She was pretreated with 400 mcg of vaginal Cytotec.  Due to distorted anatomy, an endometrial biopsy was unsuccessful in office.  Today patient reports that she is still having some bleeding.  Wearing pads.  Not soaking through them.  May go few days without bleeding and then will have spotting.  Slight increase in bleeding since the attempt at endometrial biopsy and Cytotec.  Otherwise, she notes she has increased her exercise and lost weight in other parts of her body but not her abdomen.  Has lost about 40 pounds since January.  Notes stable early satiety since her gallbladder was removed.  Having more constipation lately and then some diarrhea, subsequent return of constipation.  Using MiraLAX occasionally which helps sometimes.  Also started fiber supplements.  Otherwise denies new abdominal bloating, change in bladder habits.    PAST MEDICAL HISTORY: Past Medical History:  Diagnosis Date   allergies    Allergy    Anemia    Anxiety    Blood transfusion without reported diagnosis    Chronic headaches    Depression    Eating  disorder    Fatigue    Generalized abdominal pain 01/18/2020   GERD (gastroesophageal reflux disease)    History of chicken pox    Irritable bowel syndrome (IBS)    Migraine    Urine incontinence     PAST SURGICAL HISTORY: Past Surgical History:  Procedure Laterality Date   BREAST BIOPSY Left 04/20/2023   Stereo bx, x-clip, path pending   BREAST BIOPSY Left  04/20/2023   stereo bx, coil clip, path pending   BREAST BIOPSY Left 04/20/2023   stereo bx, ribbon clip, path pending   BREAST BIOPSY Left 04/20/2023   MM LT BREAST BX W LOC DEV 1ST LESION IMAGE BX SPEC STEREO GUIDE 04/20/2023 [facility]   BREAST BIOPSY Left 04/20/2023   MM LT BREAST BX W LOC DEV EA AD LESION IMG BX SPEC STEREO GUIDE 04/20/2023 [facility]   BREAST BIOPSY Left 04/20/2023   MM LT BREAST BX W LOC DEV EA AD LESION IMG BX SPEC STEREO GUIDE 04/20/2023 [facility]   BREAST BIOPSY  06/22/2023   MM LT RADIOACTIVE SEED EA ADD LESION LOC MAMMO GUIDE 06/22/2023 [facility]   BREAST BIOPSY  06/22/2023   MM LT RADIOACTIVE SEED EA ADD LESION LOC MAMMO GUIDE 06/22/2023 [facility]   BREAST BIOPSY  06/22/2023   MM LT RADIOACTIVE SEED LOC MAMMO GUIDE 06/22/2023 [facility]   BREAST LUMPECTOMY WITH RADIOACTIVE SEED LOCALIZATION Left 06/24/2023   Procedure: LEFT BREAST RADIOACTIVE SEED LOCALIZED LUMPECTOMY x3;  Surgeon: Curvin Deward MOULD, MD;  Location: [facility];  Service: General;  Laterality: Left;   CESAREAN SECTION  10/12/1994   CHOLECYSTECTOMY N/A 01/21/2019   Procedure: LAPAROSCOPIC CHOLECYSTECTOMY -No grams;  Surgeon: Jordis Laneta FALCON, MD;  Location: [facility];  Service: General;  Laterality: N/A;   Dental Implant  10/13/2011   DILATION AND CURETTAGE OF UTERUS     x2 Miscarriages 1994 & 1995    OB/GYN HISTORY: OB History  Gravida Para Term Preterm AB Living  3 1 1  2 2   SAB IAB Ectopic Multiple Live Births  2   1 2     # Outcome Date GA Lbr Len/2nd Weight Sex Type Anes PTL Lv  3A Term      CS-LTranv   LIV  3B Term      CS-LTranv   LIV  2 SAB           1 SAB               Age at menarche: 11 Age at menopause: 84 Hx of HRT: no Hx of STI: no Last pap: 04/04/24 NILM HPV HR neg History of abnormal pap smears: one abnormal age 71, normal since  SCREENING STUDIES:  Last mammogram: 01/2024 Last colonoscopy: none  MEDICATIONS:  Current  Outpatient Medications:    famotidine  (PEPCID ) 20 MG tablet, Take 20 mg by mouth 2 (two) times daily., Disp: , Rfl:    fluticasone (FLONASE) 50 MCG/ACT nasal spray, Place 2 sprays into both nostrils daily as needed for allergies or rhinitis., Disp: , Rfl:    loratadine (CLARITIN) 10 MG tablet, Take 10 mg by mouth daily as needed for allergies., Disp: , Rfl:    Multiple Vitamin (MULTIVITAMIN) tablet, Take 1 tablet by mouth daily., Disp: , Rfl:    Multiple Vitamins-Minerals (PRESERVISION AREDS PO), Take 1 tablet by mouth daily., Disp: , Rfl:   ALLERGIES: Allergies  Allergen Reactions   Zithromax [Azithromycin] Diarrhea   Penicillins Rash    Did it involve swelling  of the face/tongue/throat, SOB, or low BP? No Did it involve sudden or severe rash/hives, skin peeling, or any reaction on the inside of your mouth or nose? YES Did you need to seek medical attention at a hospital or doctor's office? No When did it last happen?       If all above answers are "NO", may proceed with cephalosporin use.    FAMILY HISTORY: Family History  Problem Relation Age of Onset   Heart disease Father 3   Atrial fibrillation Father    Prostate cancer Maternal Uncle    Heart disease Paternal Aunt    Kidney cancer Maternal Grandmother    Lung cancer Maternal Grandfather    Heart disease Paternal Grandmother    Lung cancer Paternal Grandmother    Stroke Paternal Grandmother    Lung cancer Paternal Grandfather    Breast cancer Neg Hx    Colon cancer Neg Hx    Pancreatic cancer Neg Hx    Ovarian cancer Neg Hx    Endometrial cancer Neg Hx     SOCIAL HISTORY: Social History   Socioeconomic History   Marital status: Divorced    Spouse name: Not on file   Number of children: 2   Years of education: Not on file   Highest education level: Not on file  Occupational History   Occupation: Occupational hygienist: [employer]  Tobacco Use   Smoking status: Former    Current packs/day: 1.00     Average packs/day: 1 pack/day for 12.0 years (12.0 ttl pk-yrs)    Types: Cigarettes   Smokeless tobacco: Never  Vaping Use   Vaping status: Never Used  Substance and Sexual Activity   Alcohol use: No   Drug use: No   Sexual activity: Not Currently    Partners: Male  Other Topics Concern   Not on file  Social History Narrative   Divorced, twins age 32   Diet: moderate, water   Exercise: walks sporadically   Social Drivers of Corporate investment banker Strain: Not on file  Food Insecurity: Not on file  Transportation Needs: Not on file  Physical Activity: Not on file  Stress: Not on file  Social Connections: Not on file  Intimate Partner Violence: Not on file    REVIEW OF SYSTEMS: New patient intake form was reviewed.  Complete 10-system review is negative except for the following: Palpitations, constipation, urinary frequency, abdominal distention, diarrhea, back pain, right foot/ankle numbness, anxiety, chest pain, abdominal pain, vaginal bleeding, ringing in ears  PHYSICAL EXAM: BP 123/61 (BP Location: Right Arm, Patient Position: Sitting)   Pulse 100   Temp 98.1 F (36.7 C) (Oral)   Resp 19   Ht 5' 7 (1.702 m)   Wt 246 lb 12.8 oz (111.9 kg)   LMP 09/25/2017   SpO2 97%   BMI 38.65 kg/m  Constitutional: No acute distress. Neuro/Psych: Alert, oriented.  Head and Neck: Normocephalic, atraumatic. Neck symmetric without masses. Sclera anicteric.  Respiratory: Normal work of breathing. Clear to auscultation bilaterally. Cardiovascular: Regular rate and rhythm, no murmurs, rubs, or gallops. Abdomen: Normoactive bowel sounds. Soft, non-distended, non-tender to palpation. No masses appreciated.Well healed laparoscopic, pfannenstiel incisions Extremities: Grossly normal range of motion. Warm, well perfused. No edema bilaterally. Skin: No rashes or lesions. Lymphatic: No cervical, supraclavicular, or inguinal adenopathy. Genitourinary: External genitalia without  lesions. Urethral meatus without lesions or prolapse. On speculum exam, vagina and cervix without lesions. Small cervix, nearly flush with vaginal  apex. Bimanual exam reveals normal cervix and mobile, mildly enlarged uterus. Exam chaperoned by Eleanor Epps, NP  EMB Procedure: After appropriate verbal informed consent was obtained, a timeout was performed. A sterile speculum was placed in the vagina, and the area was cleaned with betadine x3. Lidocaine  was infiltrated on the anterior lip of the cervix. A cervical block was then performed with 15ml of 2% lidocaine . A single-tooth tenaculum was placed on the anterior lip of the cervix. The os finder was used to dilate the cervix. An endometrial biopsy pipelle was advanced carefully to the uterine fundus which sounded to 10cm. An adequate sample was obtained over 1 pass. The tenaculum was removed, and tenaculum sites were noted to be hemostatic. The speculum was removed from the vagina. The patient tolerated the procedure well.   UPT: not indicated    LABORATORY AND RADIOLOGIC DATA: Outside medical records were reviewed to synthesize the above history, along with the history and physical obtained during the visit.  Outside laboratory, pathology, and imaging reports were reviewed, with pertinent results below.    WBC  Date Value Ref Range Status  12/18/2022 8.2 4.0 - 10.5 K/uL Final   Hemoglobin  Date Value Ref Range Status  12/18/2022 13.8 12.0 - 15.0 g/dL Final   HCT  Date Value Ref Range Status  12/18/2022 41.1 36.0 - 46.0 % Final   Platelets  Date Value Ref Range Status  12/18/2022 298.0 150.0 - 400.0 K/uL Final   Creatinine, Ser  Date Value Ref Range Status  12/18/2022 0.67 0.40 - 1.20 mg/dL Final   AST  Date Value Ref Range Status  12/18/2022 17 0 - 37 U/L Final   ALT  Date Value Ref Range Status  12/18/2022 15 0 - 35 U/L Final    Pelvic ultrasound (04/18/2024): Retroverted uterus noted with thickened endometrium (28 mm) and  internal hypervascularity.  3D was added to evaluate the endometrium and uterine contour.  A limited transabdominal was also added to better visualize the ovaries.  The right ovary was poorly seen.  Right adnexa within normal limits.  No free fluid

## 2024-05-01 NOTE — Addendum Note (Signed)
 Addended by: ELDONNA MAYS on: 05/01/2024 04:41 PM   Modules accepted: Orders

## 2024-05-05 LAB — SURGICAL PATHOLOGY

## 2024-05-08 ENCOUNTER — Encounter: Payer: Self-pay | Admitting: Psychiatry

## 2024-05-08 ENCOUNTER — Inpatient Hospital Stay (HOSPITAL_BASED_OUTPATIENT_CLINIC_OR_DEPARTMENT_OTHER): Admitting: Psychiatry

## 2024-05-08 VITALS — BP 127/63 | HR 95 | Temp 98.1°F | Resp 19 | Wt 243.0 lb

## 2024-05-08 DIAGNOSIS — F419 Anxiety disorder, unspecified: Secondary | ICD-10-CM | POA: Diagnosis not present

## 2024-05-08 DIAGNOSIS — Z8051 Family history of malignant neoplasm of kidney: Secondary | ICD-10-CM | POA: Diagnosis not present

## 2024-05-08 DIAGNOSIS — K580 Irritable bowel syndrome with diarrhea: Secondary | ICD-10-CM | POA: Diagnosis not present

## 2024-05-08 DIAGNOSIS — Z801 Family history of malignant neoplasm of trachea, bronchus and lung: Secondary | ICD-10-CM | POA: Diagnosis not present

## 2024-05-08 DIAGNOSIS — C541 Malignant neoplasm of endometrium: Secondary | ICD-10-CM | POA: Diagnosis not present

## 2024-05-08 DIAGNOSIS — Z87891 Personal history of nicotine dependence: Secondary | ICD-10-CM | POA: Diagnosis not present

## 2024-05-08 DIAGNOSIS — F32A Depression, unspecified: Secondary | ICD-10-CM | POA: Diagnosis not present

## 2024-05-08 DIAGNOSIS — N950 Postmenopausal bleeding: Secondary | ICD-10-CM

## 2024-05-08 DIAGNOSIS — N854 Malposition of uterus: Secondary | ICD-10-CM | POA: Diagnosis not present

## 2024-05-08 DIAGNOSIS — R9389 Abnormal findings on diagnostic imaging of other specified body structures: Secondary | ICD-10-CM | POA: Diagnosis not present

## 2024-05-08 DIAGNOSIS — Z79899 Other long term (current) drug therapy: Secondary | ICD-10-CM | POA: Diagnosis not present

## 2024-05-08 DIAGNOSIS — Z6838 Body mass index (BMI) 38.0-38.9, adult: Secondary | ICD-10-CM | POA: Diagnosis not present

## 2024-05-08 DIAGNOSIS — Z8042 Family history of malignant neoplasm of prostate: Secondary | ICD-10-CM | POA: Diagnosis not present

## 2024-05-08 DIAGNOSIS — K219 Gastro-esophageal reflux disease without esophagitis: Secondary | ICD-10-CM | POA: Diagnosis not present

## 2024-05-08 NOTE — Progress Notes (Signed)
 Gynecologic Oncology Return Clinic Visit  Date of Service: 05/08/2024 Referring Provider: Burnard Bowers, MD   Assessment & Plan: Lori Kaiser is a 63 y.o. 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  We reviewed the nature of endometrial cancer and its recommended surgical staging, including total hysterectomy, bilateral salpingo-oophorectomy, and lymph node assessment. The patient is a suitable candidate for staging via a minimally invasive approach to surgery.  We reviewed that robotic assistance would be used to complete the surgery.   We discussed that most endometrial cancer is detected early and that decisions regarding adjuvant therapy will be made based on her final pathology.   CT scan is scheduled for tomorrow. This was ordered given her abdominal symptoms and duration of bleeding. Reviewed that in rare cases if evidence of distant disease would potentially start with chemo and/or radiation prior to surgery.  We reviewed the sentinel lymph node technique. Risks and benefits of sentinel lymph node biopsy was reviewed. We reviewed the technique and ICG dye. The patient DOES NOT have an iodine allergy or known liver dysfunction. We reviewed the false negative rate (0.4%), and that 3% of patients with metastatic disease will not have it detected by SLN biopsy in endometrial cancer. A low risk of allergic reaction to the dye, <0.2% for ICG, has been reported. We also discussed that in the case of failed mapping, which occurs 40% of the time, a bilateral or unilateral lymphadenectomy will be performed at the surgeon's discretion.   Potential benefits of sentinel nodes including a higher detection rate for metastasis due to ultrastaging and potential reduction in operative morbidity. However, there remains uncertainty as to the role for treatment of micrometastatic disease. Further, the benefit of operative morbidity associated  with the SLN technique in endometrial cancer is not yet completely known. In other patient populations (e.g. the cervical cancer population) there has been observed reductions in morbidity with SLN biopsy compared to pelvic lymphadenectomy. Lymphedema, nerve dysfunction and lymphocysts are all potential risks with the SLN technique as with complete lymphadenectomy. Additional risks to the patient include the risk of damage to an internal organ while operating in an altered view (e.g. the black and white image of the robotic fluorescence imaging mode).   Patient was consented for: Robotic assisted total laparoscopic hysterectomy, bilateral salpingo-oophorectomy, sentinel lymph node evaluation and biopsy, possible lymph node dissection, possible mini-laparotomy for specimen retrieval on TBD. Will see if sooner OR date available at [facility].  The risks of surgery were discussed in detail and she understands these to including but not limited to bleeding requiring a blood transfusion, infection, injury to adjacent organs (including but not limited to the bowels, bladder, ureters, nerves, blood vessels), thromboembolic events, wound separation, hernia, vaginal cuff separation, possible risk of lymphedema and lymphocyst if lymphadenectomy performed, unforseen complication, and possible need for re-exploration.  If the patient experiences any of these events, she understands that her hospitalization or recovery may be prolonged and that she may need to take additional medications for a prolonged period. The patient will receive DVT and antibiotic prophylaxis as indicated. She voiced a clear understanding. She had the opportunity to ask questions and informed consent was obtained today. She wishes to proceed.  Will follow-up CT from tomorrow. MLH1 promotor hypermethylation testing requested. She does not require preoperative clearance. Her METs are >4.  All preoperative instructions were reviewed. Postoperative  expectations were also reviewed. Written handouts were provided to the patient.  RTC postop.  Hoy Masters, MD Gynecologic Oncology   Medical Decision Making I personally spent  TOTAL 45 minutes face-to-face and non-face-to-face in the care of this patient, which includes all pre, intra, and post visit time on the date of service.   ----------------------- Reason for Visit: Follow-up  Treatment History: Oncology History  Endometrial cancer (HCC)  05/01/2024 Initial Diagnosis   Endometrial cancer (HCC)   05/01/2024 Initial Biopsy   A. ENDOMETRIAL BIOPSY:       Endometrioid adenocarcinoma, FIGO grade 1.       Extensive squamous metaplasia.       See comment.   COMMENT:  The case was peer-reviewed by Dr. Belvie who agrees with the diagnosis.   ADDENDUM:  Immunohistochemical stains show the tumor cells are positive for ER  (partial but strong positivity). P53 is wild type pattern of staining.   IHC EXPRESSION RESULTS TEST           RESULT MLH1:          LOSS OF NUCLEAR EXPRESSION MSH2:          Preserved nuclear expression MSH6:          Preserved nuclear expression PMS2:          LOSS OF NUCLEAR EXPRESSION      Interval History: Reports that she had a bit more spotting when she would wipe after the biopsy. But now her spotting is minimal, just here and there. No other major changes.    Past Medical/Surgical History: Past Medical History:  Diagnosis Date   allergies    Allergy    Anemia    Anxiety    Blood transfusion without reported diagnosis    Chronic headaches    Depression    Eating disorder    Fatigue    Generalized abdominal pain 01/18/2020   GERD (gastroesophageal reflux disease)    History of chicken pox    Irritable bowel syndrome (IBS)    Migraine    Urine incontinence     Past Surgical History:  Procedure Laterality Date   BREAST BIOPSY Left 04/20/2023   Stereo bx, x-clip, path pending   BREAST BIOPSY Left 04/20/2023   stereo bx,  coil clip, path pending   BREAST BIOPSY Left 04/20/2023   stereo bx, ribbon clip, path pending   BREAST BIOPSY Left 04/20/2023   MM LT BREAST BX W LOC DEV 1ST LESION IMAGE BX SPEC STEREO GUIDE 04/20/2023 [facility]   BREAST BIOPSY Left 04/20/2023   MM LT BREAST BX W LOC DEV EA AD LESION IMG BX SPEC STEREO GUIDE 04/20/2023 [facility]   BREAST BIOPSY Left 04/20/2023   MM LT BREAST BX W LOC DEV EA AD LESION IMG BX SPEC STEREO GUIDE 04/20/2023 [facility]   BREAST BIOPSY  06/22/2023   MM LT RADIOACTIVE SEED EA ADD LESION LOC MAMMO GUIDE 06/22/2023 [facility]   BREAST BIOPSY  06/22/2023   MM LT RADIOACTIVE SEED EA ADD LESION LOC MAMMO GUIDE 06/22/2023 [facility]   BREAST BIOPSY  06/22/2023   MM LT RADIOACTIVE SEED LOC MAMMO GUIDE 06/22/2023 [facility]   BREAST LUMPECTOMY WITH RADIOACTIVE SEED LOCALIZATION Left 06/24/2023   Procedure: LEFT BREAST RADIOACTIVE SEED LOCALIZED LUMPECTOMY x3;  Surgeon: Curvin Deward MOULD, MD;  Location: [facility];  Service: General;  Laterality: Left;   CESAREAN SECTION  10/12/1994   CHOLECYSTECTOMY N/A 01/21/2019   Procedure: LAPAROSCOPIC CHOLECYSTECTOMY -No grams;  Surgeon: Jordis Laneta FALCON, MD;  Location: [facility];  Service: General;  Laterality: N/A;   Dental Implant  10/13/2011   DILATION AND CURETTAGE OF UTERUS     x2 Miscarriages 1994 & 1995    Family History  Problem Relation Age of Onset   Heart disease Father 59   Atrial fibrillation Father    Prostate cancer Maternal Uncle    Heart disease Paternal Aunt    Kidney cancer Maternal Grandmother    Lung cancer Maternal Grandfather    Heart disease Paternal Grandmother    Lung cancer Paternal Grandmother    Stroke Paternal Grandmother    Lung cancer Paternal Grandfather    Breast cancer Neg Hx    Colon cancer Neg Hx    Pancreatic cancer Neg Hx    Ovarian cancer Neg Hx    Endometrial cancer Neg Hx     Social History   Socioeconomic History   Marital status:  Divorced    Spouse name: Not on file   Number of children: 2   Years of education: Not on file   Highest education level: Not on file  Occupational History   Occupation: Occupational hygienist: [employer]  Tobacco Use   Smoking status: Former    Current packs/day: 1.00    Average packs/day: 1 pack/day for 12.0 years (12.0 ttl pk-yrs)    Types: Cigarettes   Smokeless tobacco: Never  Vaping Use   Vaping status: Never Used  Substance and Sexual Activity   Alcohol use: No   Drug use: No   Sexual activity: Not Currently    Partners: Male  Other Topics Concern   Not on file  Social History Narrative   Divorced, twins age 80   Diet: moderate, water   Exercise: walks sporadically   Social Drivers of Corporate investment banker Strain: Not on file  Food Insecurity: Not on file  Transportation Needs: Not on file  Physical Activity: Not on file  Stress: Not on file  Social Connections: Not on file    Current Medications:  Current Outpatient Medications:    famotidine  (PEPCID ) 20 MG tablet, Take 20 mg by mouth 2 (two) times daily., Disp: , Rfl:    fluticasone (FLONASE) 50 MCG/ACT nasal spray, Place 2 sprays into both nostrils daily as needed for allergies or rhinitis., Disp: , Rfl:    loratadine (CLARITIN) 10 MG tablet, Take 10 mg by mouth daily as needed for allergies., Disp: , Rfl:    Multiple Vitamin (MULTIVITAMIN) tablet, Take 1 tablet by mouth daily., Disp: , Rfl:    Multiple Vitamins-Minerals (PRESERVISION AREDS PO), Take 1 tablet by mouth daily., Disp: , Rfl:   Review of Symptoms: Complete 10-system review is negative except as above in Interval History.  Physical Exam: BP 127/63 (BP Location: Left Arm, Patient Position: Sitting)   Pulse 95   Temp 98.1 F (36.7 C) (Oral)   Resp 19   Wt 243 lb (110.2 kg)   LMP 09/25/2017   SpO2 97%   BMI 38.06 kg/m  General: Alert, oriented, no acute distress. HEENT: Normocephalic, atraumatic. Neck symmetric  without masses. Sclera anicteric.  Chest: Normal work of breathing.  Extremities: Grossly normal range of motion.     Laboratory & Radiologic Studies: Surgical pathology (05/01/24): A. ENDOMETRIAL BIOPSY:       Endometrioid adenocarcinoma, FIGO grade 1.       Extensive squamous metaplasia.       See comment.   COMMENT:  The case was peer-reviewed by Dr. Belvie who agrees with the  diagnosis.   ADDENDUM:  Immunohistochemical stains show the tumor cells are positive for ER  (partial but strong positivity). P53 is wild type pattern of staining.   IHC EXPRESSION RESULTS TEST           RESULT MLH1:          LOSS OF NUCLEAR EXPRESSION MSH2:          Preserved nuclear expression MSH6:          Preserved nuclear expression PMS2:          LOSS OF NUCLEAR EXPRESSION

## 2024-05-08 NOTE — Patient Instructions (Addendum)
 YOU WILL NEED TO ARRIVE 2 HOURS BEFORE YOUR CT SCAN TO BEGIN DRINKING ORAL CONTRAST. NOTHING TO EAT OR DRINK 4 HOURS BEFORE YOUR CT SCAN.   Hello, please see below for the preoperative instructions for [facility].  IMPORTANT INFORMATION FOR YOUR SURGERY   You are scheduled for surgery on  ___TBD___ with Dr. __Newton__.  If you are unable to keep your scheduled surgery date or for any questions or concerns, please call the Advice Nurse at [phone]. For any last minute concerns or cancellations, please call the Hospital Operator at [phone] and ask for the Ob/Gyn Consult Resident on call.   When to come in for surgery: The [facility] will call you the afternoon before surgery (or Friday for Monday surgery) to tell you what time to come in.  If you are not contacted by 4PM, then call [phone].  Do not wait until after 5PM because the office will be closed!  After 5PM, call the Operating Room at [phone]; after 8:30PM, please call [phone].    One week before surgery:  NO full strength aspirin (baby aspirin (81mg ) is ok) for 7 days before surgery.   Please avoid Ibuprofen/Motrin/Aleve.  Any acetaminophen  (Tylenol ) product is ok.       What to eat and drink:   -- Please?do not eat anything solid after midnight?before your procedure.  -- You may drink only clear liquids (water, clear juice, Gatorade) until?1 hour?before your scheduled arrival time at the hospital.  -- Please drink one 12 ounce bottle of gatorade 1 hour prior to scheduled arrival time.      On the day of surgery:   -- Check in at the [facility].  -- Once in the preoperative area, the surgery team will come to answer any last-minute questions before you go to the operating room and you will meet your anesthesia team.   -- You may receive an injection of short-acting blood thinner before your surgery.       After surgery:  All of these guidelines may be modified by your doctor. Your individual care  may be different from what is listed here. Review your post-op instructions.  -- You will most likely go home on the day of surgery.  -- You will have a regular diet after surgery. You may eat what you want, but don't push yourself. Small bites of bland food are best to start. Eat a bite or two of food, then wait 10-15 minutes and see how you feel. If you feel nauseated or not hungry, don't eat.  -- We want you to be up and walking on the day of surgery. Walking is good.    -- Showers are ok after 24 hours. Be gentle with the incisions. Pat dry. No tub baths for the first 4 weeks.  -- First 6 weeks after surgery: No lifting more than 15 pounds, straining, physical exertion, or vigorous exercise.  This is important in order to ensure adequate healing.  -- IMPORTANT if you have had a hysterectomy: No intercourse, douching, or tampon use for 8-12 weeks following surgery. For all other procedures, your surgeon will discuss time to resume these activities.  -- Driving will depend on the specific type of surgery you have had and when you stop needing narcotic pain medication--this may vary from as little as 2 days up to 6 weeks depending on the type of surgery you had. You would need to be strong enough to use your car brakes in an emergency. Your  doctor may give you more specific guidelines.    -- First 6 weeks after surgery:  1. No lifting more than 15 pounds, straining, physical exertion, or vigorous exercise.  This is important in order to ensure adequate healing.  2. Walking is good. However, try to minimize stair use to 1-2 times a day.  3. Driving will depend on the specific type of surgery you have had and when you stop needing narcotic pain medication--this may vary from as little as 2 days up to 6 weeks depending on the type of surgery you had. You would need to be strong enough to use your car brakes in an emergency.  Your doctor may give you more specific guidelines.  IMPORTANT: No intercourse,  douching, or tampon use for 8-12 weeks following surgery.     Phone numbers:  - If you have the contact information for your surgeon's nurse, you can contact them.  - Nursing advice line 9am - 4:30pm: [phone]  - After-hours emergency: [phone], ask for Ob/Gyn Consult resident on-call.    Reasons to Call the Nurse  -- Bleeding (more than spotting)  -- Nausea with vomiting  -- Unable to empty your bladder despite feeling an urge or feeling that your bladder is full  -- Incision problems (pus or other fluid coming out, redness, warmth, increased pain). If your incision opens - do not panic! Just because the skin opens it does not mean that the layers underneath have opened.  -- Fever (100.4 degrees or more)      Things to Expect After Surgery  -- Moderate pain is normal during the first several weeks after surgery. After 5 days or so Ibuprofen may bring more comfort than the stronger pain medicine. Try taking Ibuprofen first and use the stronger medicine for "break-through" pain. You can overlap these medicines because they work differently. If you cannot take Ibuprofen, you can try Extra Strength Tylenol .    -- Constipation -- IT IS ESSENTIAL TO AVOID THIS!!!  Straining with bowel movements after surgery can tear your sutures. This may occur due to dehydration, medications, or a change in activity and eating habits.  Depending on specific instructions from your physician, take Colace twice daily and/or a daily dose of Senna as long as you're on pain medications.  If you do not have a bowel movement in 2 days after surgery, you can take Miralax once per day OR 2 tbs of Milk of Magnesia 1-2 times a day until you have a bowel movement. If diarrhea occurs, decrease the amount or stop the laxative. If no results with Milk of Magnesia, you can drink a bottle of magnesium citrate which you can purchase over the counter.  -- To Prevent Constipation:  Eat a well-balanced diet including protein,  grains, fresh fruit and vegetables (unless you have been placed on a Low Residual Diet by your doctor).  Drink plenty of fluids (see above). Walk regularly.  -- Fatigue - This is a normal response to surgery and will lessen with time.  Plan frequent rest periods throughout the day - especially after bathing, eating, or dressing. Increase your walking as tolerated.  -- Gas Pain - This is very common but can also be very painful! Drink warm liquids such as herbal teas, bouillon or soup. Walking will help you pass more gas.  Mylicon or Gas-X can be taken over the counter.  -- Vaginal Discharge - This may or may not occur after surgery and may last 6-8 weeks after surgery.  It is part of the healing process. Do not place anything in the vagina (except estrogen cream if your physician prescribed it for you). You may use a panty liner.  If the discharge becomes heavy, odorous, bloody, or itches, call the nurse.     Do's and Don'ts  -- Incisions -  If you have Steri-strips on your incision, these will start to peel off in a week or so. They can get wet in the shower and need no special care. Please remove them in 7 days if they have not already peeled off.  -- Exercise - You may walk and gradually increase your activity level as your energy level permits.  Check with your doctor at your post-op visit to see when you can resume normal activity and exercise.  -- Returning to Work - As work demands and recovery times vary widely, it is hard to predict when you will want to return to work. If you have a desk job with no strenuous physical activity, and if you would like to return sooner than generally recommended, this can be arranged.  -- Drink plenty of water - 6-8 8oz. glasses per day.  Avoid caffeine when possible.  -- Elevate your feet when you are sitting.  If there is fluid collection in your feet, this will help to get rid of it.

## 2024-05-09 ENCOUNTER — Encounter: Payer: Self-pay | Admitting: Oncology

## 2024-05-09 ENCOUNTER — Ambulatory Visit (HOSPITAL_COMMUNITY)
Admission: RE | Admit: 2024-05-09 | Discharge: 2024-05-09 | Disposition: A | Discharge status: Home / Self Care | Source: Ambulatory Visit | Attending: Psychiatry | Admitting: Psychiatry

## 2024-05-09 DIAGNOSIS — R140 Abdominal distension (gaseous): Secondary | ICD-10-CM | POA: Diagnosis not present

## 2024-05-09 DIAGNOSIS — R9389 Abnormal findings on diagnostic imaging of other specified body structures: Secondary | ICD-10-CM | POA: Diagnosis not present

## 2024-05-09 DIAGNOSIS — R109 Unspecified abdominal pain: Secondary | ICD-10-CM | POA: Diagnosis not present

## 2024-05-09 DIAGNOSIS — N950 Postmenopausal bleeding: Secondary | ICD-10-CM | POA: Diagnosis not present

## 2024-05-09 DIAGNOSIS — I728 Aneurysm of other specified arteries: Secondary | ICD-10-CM | POA: Diagnosis not present

## 2024-05-09 MED ORDER — IOHEXOL 300 MG/ML  SOLN
100.0000 mL | Freq: Once | INTRAMUSCULAR | Status: AC | PRN
  Administered 2024-05-09: 100 mL via INTRAVENOUS

## 2024-05-09 MED ORDER — IOHEXOL 9 MG/ML PO SOLN
500.0000 mL | ORAL | Status: AC
  Administered 2024-05-09 (×2): 500 mL via ORAL

## 2024-05-09 NOTE — Progress Notes (Signed)
 Requested MLH1 promoter hypermethylation on accession [id number] with [facility] via email.

## 2024-05-16 ENCOUNTER — Ambulatory Visit: Payer: Self-pay | Admitting: Psychiatry

## 2024-05-22 ENCOUNTER — Other Ambulatory Visit: Payer: Self-pay | Admitting: Psychiatry

## 2024-05-22 DIAGNOSIS — I728 Aneurysm of other specified arteries: Secondary | ICD-10-CM

## 2024-05-22 NOTE — Telephone Encounter (Signed)
 RTC to patient in regards to COVID vaccine.  Made aware OK prior to surgery. Would advise as soon as possible so that any post vaccine fevers would not be related to surgery or healing concerns.

## 2024-05-23 ENCOUNTER — Encounter: Payer: Self-pay | Admitting: Licensed Clinical Social Worker

## 2024-05-23 ENCOUNTER — Telehealth: Payer: Self-pay | Admitting: Licensed Clinical Social Worker

## 2024-05-23 NOTE — Telephone Encounter (Signed)
 [facility] Clinical Social Work  Clinical Social Work was referred by new patient protocol for assessment of psychosocial needs.  Clinical Social Worker attempted to contact patient by phone to offer support and assess for needs.   No answer. Left VM with direct contact information and brief description of support services.     Jerimy Johanson E Carlis Burnsworth, LCSW  Clinical Social Worker [facility]

## 2024-05-23 NOTE — Progress Notes (Signed)
 [facility] Clinical Social Work  Pt returned call to CSW regarding new pt needs. Pt stated she is ready for surgery and has good support from family and friends. No needs at this time.   Lori Pita E Melenda Bielak, LCSW

## 2024-05-29 ENCOUNTER — Encounter (HOSPITAL_COMMUNITY): Payer: Self-pay | Admitting: Psychiatry

## 2024-05-30 ENCOUNTER — Encounter (HOSPITAL_COMMUNITY): Payer: Self-pay | Admitting: Psychiatry

## 2024-06-01 DIAGNOSIS — G5602 Carpal tunnel syndrome, left upper limb: Secondary | ICD-10-CM | POA: Diagnosis not present

## 2024-06-01 DIAGNOSIS — Y658 Other specified misadventures during surgical and medical care: Secondary | ICD-10-CM | POA: Diagnosis not present

## 2024-06-01 DIAGNOSIS — Z9181 History of falling: Secondary | ICD-10-CM | POA: Diagnosis not present

## 2024-06-01 DIAGNOSIS — C541 Malignant neoplasm of endometrium: Secondary | ICD-10-CM | POA: Diagnosis not present

## 2024-06-01 DIAGNOSIS — Z6839 Body mass index (BMI) 39.0-39.9, adult: Secondary | ICD-10-CM | POA: Diagnosis not present

## 2024-06-01 DIAGNOSIS — E669 Obesity, unspecified: Secondary | ICD-10-CM | POA: Diagnosis not present

## 2024-06-01 DIAGNOSIS — N8003 Adenomyosis of the uterus: Secondary | ICD-10-CM | POA: Diagnosis not present

## 2024-06-01 DIAGNOSIS — D259 Leiomyoma of uterus, unspecified: Secondary | ICD-10-CM | POA: Diagnosis not present

## 2024-06-01 DIAGNOSIS — N7011 Chronic salpingitis: Secondary | ICD-10-CM | POA: Diagnosis not present

## 2024-06-01 DIAGNOSIS — K219 Gastro-esophageal reflux disease without esophagitis: Secondary | ICD-10-CM | POA: Diagnosis not present

## 2024-06-01 DIAGNOSIS — Z87891 Personal history of nicotine dependence: Secondary | ICD-10-CM | POA: Diagnosis not present

## 2024-06-01 DIAGNOSIS — R269 Unspecified abnormalities of gait and mobility: Secondary | ICD-10-CM | POA: Diagnosis not present

## 2024-06-01 DIAGNOSIS — N9971 Accidental puncture and laceration of a genitourinary system organ or structure during a genitourinary system procedure: Secondary | ICD-10-CM | POA: Diagnosis not present

## 2024-06-01 DIAGNOSIS — R42 Dizziness and giddiness: Secondary | ICD-10-CM | POA: Diagnosis not present

## 2024-06-01 DIAGNOSIS — N994 Postprocedural pelvic peritoneal adhesions: Secondary | ICD-10-CM | POA: Diagnosis not present

## 2024-06-01 DIAGNOSIS — Z7409 Other reduced mobility: Secondary | ICD-10-CM | POA: Diagnosis not present

## 2024-06-01 DIAGNOSIS — S3141XA Laceration without foreign body of vagina and vulva, initial encounter: Secondary | ICD-10-CM | POA: Diagnosis not present

## 2024-06-01 DIAGNOSIS — L720 Epidermal cyst: Secondary | ICD-10-CM | POA: Diagnosis not present

## 2024-06-01 DIAGNOSIS — Z79899 Other long term (current) drug therapy: Secondary | ICD-10-CM | POA: Diagnosis not present

## 2024-06-01 HISTORY — PX: ABDOMINAL HYSTERECTOMY: SHX81

## 2024-06-02 DIAGNOSIS — N9971 Accidental puncture and laceration of a genitourinary system organ or structure during a genitourinary system procedure: Secondary | ICD-10-CM | POA: Diagnosis not present

## 2024-06-02 DIAGNOSIS — Z9181 History of falling: Secondary | ICD-10-CM | POA: Diagnosis not present

## 2024-06-02 DIAGNOSIS — S3141XA Laceration without foreign body of vagina and vulva, initial encounter: Secondary | ICD-10-CM | POA: Diagnosis not present

## 2024-06-02 DIAGNOSIS — Y658 Other specified misadventures during surgical and medical care: Secondary | ICD-10-CM | POA: Diagnosis not present

## 2024-06-02 DIAGNOSIS — C541 Malignant neoplasm of endometrium: Secondary | ICD-10-CM | POA: Diagnosis not present

## 2024-06-02 DIAGNOSIS — E669 Obesity, unspecified: Secondary | ICD-10-CM | POA: Diagnosis not present

## 2024-06-02 DIAGNOSIS — Z79899 Other long term (current) drug therapy: Secondary | ICD-10-CM | POA: Diagnosis not present

## 2024-06-02 DIAGNOSIS — R269 Unspecified abnormalities of gait and mobility: Secondary | ICD-10-CM | POA: Diagnosis not present

## 2024-06-02 DIAGNOSIS — K219 Gastro-esophageal reflux disease without esophagitis: Secondary | ICD-10-CM | POA: Diagnosis not present

## 2024-06-02 DIAGNOSIS — Z7409 Other reduced mobility: Secondary | ICD-10-CM | POA: Diagnosis not present

## 2024-06-02 DIAGNOSIS — Z87891 Personal history of nicotine dependence: Secondary | ICD-10-CM | POA: Diagnosis not present

## 2024-06-02 DIAGNOSIS — Z6839 Body mass index (BMI) 39.0-39.9, adult: Secondary | ICD-10-CM | POA: Diagnosis not present

## 2024-06-02 DIAGNOSIS — G5602 Carpal tunnel syndrome, left upper limb: Secondary | ICD-10-CM | POA: Diagnosis not present

## 2024-06-02 DIAGNOSIS — N7011 Chronic salpingitis: Secondary | ICD-10-CM | POA: Diagnosis not present

## 2024-06-02 DIAGNOSIS — D259 Leiomyoma of uterus, unspecified: Secondary | ICD-10-CM | POA: Diagnosis not present

## 2024-06-02 DIAGNOSIS — R42 Dizziness and giddiness: Secondary | ICD-10-CM | POA: Diagnosis not present

## 2024-06-02 DIAGNOSIS — L720 Epidermal cyst: Secondary | ICD-10-CM | POA: Diagnosis not present

## 2024-06-06 DIAGNOSIS — C541 Malignant neoplasm of endometrium: Secondary | ICD-10-CM | POA: Diagnosis not present

## 2024-06-06 DIAGNOSIS — K589 Irritable bowel syndrome without diarrhea: Secondary | ICD-10-CM | POA: Diagnosis not present

## 2024-06-06 DIAGNOSIS — Z9049 Acquired absence of other specified parts of digestive tract: Secondary | ICD-10-CM | POA: Diagnosis not present

## 2024-06-06 DIAGNOSIS — G43909 Migraine, unspecified, not intractable, without status migrainosus: Secondary | ICD-10-CM | POA: Diagnosis not present

## 2024-06-06 DIAGNOSIS — Z483 Aftercare following surgery for neoplasm: Secondary | ICD-10-CM | POA: Diagnosis not present

## 2024-06-06 DIAGNOSIS — F32A Depression, unspecified: Secondary | ICD-10-CM | POA: Diagnosis not present

## 2024-06-06 DIAGNOSIS — Z602 Problems related to living alone: Secondary | ICD-10-CM | POA: Diagnosis not present

## 2024-06-06 DIAGNOSIS — Z791 Long term (current) use of non-steroidal anti-inflammatories (NSAID): Secondary | ICD-10-CM | POA: Diagnosis not present

## 2024-06-06 DIAGNOSIS — K219 Gastro-esophageal reflux disease without esophagitis: Secondary | ICD-10-CM | POA: Diagnosis not present

## 2024-06-06 DIAGNOSIS — Z5982 Transportation insecurity: Secondary | ICD-10-CM | POA: Diagnosis not present

## 2024-06-06 DIAGNOSIS — D630 Anemia in neoplastic disease: Secondary | ICD-10-CM | POA: Diagnosis not present

## 2024-06-06 DIAGNOSIS — Z87891 Personal history of nicotine dependence: Secondary | ICD-10-CM | POA: Diagnosis not present

## 2024-06-06 DIAGNOSIS — F419 Anxiety disorder, unspecified: Secondary | ICD-10-CM | POA: Diagnosis not present

## 2024-06-06 DIAGNOSIS — Z90722 Acquired absence of ovaries, bilateral: Secondary | ICD-10-CM | POA: Diagnosis not present

## 2024-06-06 DIAGNOSIS — Z90710 Acquired absence of both cervix and uterus: Secondary | ICD-10-CM | POA: Diagnosis not present

## 2024-06-06 DIAGNOSIS — F509 Eating disorder, unspecified: Secondary | ICD-10-CM | POA: Diagnosis not present

## 2024-06-06 DIAGNOSIS — Z9181 History of falling: Secondary | ICD-10-CM | POA: Diagnosis not present

## 2024-06-10 ENCOUNTER — Ambulatory Visit
Admission: RE | Admit: 2024-06-10 | Discharge: 2024-06-10 | Disposition: A | Discharge status: Home / Self Care | Source: Ambulatory Visit | Attending: Emergency Medicine | Admitting: Emergency Medicine

## 2024-06-10 VITALS — BP 131/72 | HR 97 | Temp 98.2°F | Resp 20

## 2024-06-10 DIAGNOSIS — M7989 Other specified soft tissue disorders: Secondary | ICD-10-CM | POA: Diagnosis not present

## 2024-06-10 DIAGNOSIS — R220 Localized swelling, mass and lump, head: Secondary | ICD-10-CM | POA: Diagnosis not present

## 2024-06-10 MED ORDER — METHYLPREDNISOLONE ACETATE 80 MG/ML IJ SUSP: 60.0000 mg | Freq: Once | INTRAMUSCULAR | Status: DC

## 2024-06-10 MED ORDER — PREDNISONE 10 MG PO TABS: ORAL_TABLET | ORAL | 0 refills | Status: AC

## 2024-06-10 NOTE — ED Provider Notes (Signed)
 Lori Kaiser    CSN: [id number] Arrival date & time: 06/10/24  1227      History   Chief Complaint Chief Complaint  Patient presents with   Allergic Reaction    I woke up with swollen face, lips, hands and feet, possibly due to ibuprofen taken last night. Note That I had hysterectomy surgery on 8/21 for endometrial cancer. - Entered by patient    HPI Lori Kaiser is a 63 y.o. female.   Patient presents for evaluation of lip swelling, bilateral hand above ankle/foot swelling beginning.  Endorses believes related to use of a children's version of ibuprofen that she does not typically consume, endorses after taking she felt a burning sensation to the mouth and drink a lot of water to help this resolve.  Denies pruritus but endorses a burning pain, experiencing an area of soreness described as a bruise to the back.  Has not attempted treatment.  Denies respiratory involvement.  Denies changes in diet, toiletries.  Recent surgery for endometrial cancer.  Past Medical History:  Diagnosis Date   allergies    Allergy    Anemia    Anxiety    Blood transfusion without reported diagnosis    Chronic headaches    Depression    Eating disorder    Fatigue    Generalized abdominal pain 01/18/2020   GERD (gastroesophageal reflux disease)    History of chicken pox    Irritable bowel syndrome (IBS)    Migraine    Urine incontinence     Patient Active Problem List   Diagnosis Date Noted   Endometrial cancer (HCC) 05/08/2024   Prediabetes 02/02/2023   Localized skin mass, lump, or swelling 02/02/2023   Encounter for annual general medical examination with abnormal findings in adult 02/02/2023   Rash and nonspecific skin eruption 03/31/2019   Cholecystitis 01/20/2019   Generalized anxiety disorder 03/01/2014   Panic attacks 03/01/2014   GERD (gastroesophageal reflux disease) 03/01/2014   Allergic rhinitis 03/01/2014   Fibroids 03/01/2014   Stress incontinence 03/01/2014    Family history of early CAD 03/01/2014   Common migraine without aura 01/17/2013   IBS (irritable bowel syndrome) 01/17/2013   Major depressive disorder, recurrent (HCC) 01/17/2013    Past Surgical History:  Procedure Laterality Date   ABDOMINAL HYSTERECTOMY  06/01/2024   BREAST BIOPSY Left 04/20/2023   Stereo bx, x-clip, path pending   BREAST BIOPSY Left 04/20/2023   stereo bx, coil clip, path pending   BREAST BIOPSY Left 04/20/2023   stereo bx, ribbon clip, path pending   BREAST BIOPSY Left 04/20/2023   MM LT BREAST BX W LOC DEV 1ST LESION IMAGE BX SPEC STEREO GUIDE 04/20/2023 [facility]   BREAST BIOPSY Left 04/20/2023   MM LT BREAST BX W LOC DEV EA AD LESION IMG BX SPEC STEREO GUIDE 04/20/2023 [facility]   BREAST BIOPSY Left 04/20/2023   MM LT BREAST BX W LOC DEV EA AD LESION IMG BX SPEC STEREO GUIDE 04/20/2023 [facility]   BREAST BIOPSY  06/22/2023   MM LT RADIOACTIVE SEED EA ADD LESION LOC MAMMO GUIDE 06/22/2023 [facility]   BREAST BIOPSY  06/22/2023   MM LT RADIOACTIVE SEED EA ADD LESION LOC MAMMO GUIDE 06/22/2023 [facility]   BREAST BIOPSY  06/22/2023   MM LT RADIOACTIVE SEED LOC MAMMO GUIDE 06/22/2023 [facility]   BREAST LUMPECTOMY WITH RADIOACTIVE SEED LOCALIZATION Left 06/24/2023   Procedure: LEFT BREAST RADIOACTIVE SEED LOCALIZED LUMPECTOMY x3;  Surgeon: Curvin Deward MOULD, MD;  Location: [facility];  Service: General;  Laterality: Left;   CESAREAN SECTION  10/12/1994   CHOLECYSTECTOMY N/A 01/21/2019   Procedure: LAPAROSCOPIC CHOLECYSTECTOMY -No grams;  Surgeon: Jordis Laneta FALCON, MD;  Location: [facility];  Service: General;  Laterality: N/A;   Dental Implant  10/13/2011   DILATION AND CURETTAGE OF UTERUS     x2 Miscarriages 1994 & 1995    OB History     Gravida  3   Para  1   Term  1   Preterm      AB  2   Living  2      SAB  2   IAB      Ectopic      Multiple  1   Live Births  2            Home  Medications    Prior to Admission medications   Medication Sig Start Date End Date Taking? Authorizing Provider  predniSONE  (DELTASONE ) 10 MG tablet Take 30 mg (3 tablets) every morning for 2 days, then take 20 mg (2 tablets) every morning for 2 days, then take 10 mg (1 tablet) every morning for 1 day 06/10/24  Yes Jennel Mara R, NP  famotidine  (PEPCID ) 20 MG tablet Take 20 mg by mouth 2 (two) times daily.    [provider]  fluticasone (FLONASE) 50 MCG/ACT nasal spray Place 2 sprays into both nostrils daily as needed for allergies or rhinitis.    [provider]  loratadine (CLARITIN) 10 MG tablet Take 10 mg by mouth daily as needed for allergies.    [provider]  Multiple Vitamin (MULTIVITAMIN) tablet Take 1 tablet by mouth daily.    [provider]  Multiple Vitamins-Minerals (PRESERVISION AREDS PO) Take 1 tablet by mouth daily.    [provider]    Family History Family History  Problem Relation Age of Onset   Heart disease Father 56   Atrial fibrillation Father    Prostate cancer Maternal Uncle    Heart disease Paternal Aunt    Kidney cancer Maternal Grandmother    Lung cancer Maternal Grandfather    Heart disease Paternal Grandmother    Lung cancer Paternal Grandmother    Stroke Paternal Grandmother    Lung cancer Paternal Grandfather    Breast cancer Neg Hx    Colon cancer Neg Hx    Pancreatic cancer Neg Hx    Ovarian cancer Neg Hx    Endometrial cancer Neg Hx     Social History Social History   Tobacco Use   Smoking status: Former    Current packs/day: 1.00    Average packs/day: 1 pack/day for 12.0 years (12.0 ttl pk-yrs)    Types: Cigarettes   Smokeless tobacco: Never  Vaping Use   Vaping status: Never Used  Substance Use Topics   Alcohol use: No   Drug use: No     Allergies   Zithromax [azithromycin] and Penicillins   Review of Systems Review of Systems   Physical Exam Triage Vital Signs ED  Triage Vitals  Encounter Vitals Group     BP 06/10/24 1304 131/72     Girls Systolic BP Percentile --      Girls Diastolic BP Percentile --      Boys Systolic BP Percentile --      Boys Diastolic BP Percentile --      Pulse Rate 06/10/24 1304 97     Resp 06/10/24 1304 20  Temp 06/10/24 1304 98.2 F (36.8 C)     Temp Source 06/10/24 1304 Oral     SpO2 06/10/24 1304 97 %     Weight --      Height --      Head Circumference --      Peak Flow --      Pain Score 06/10/24 1308 0     Pain Loc --      Pain Education --      Exclude from Growth Chart --    No data found.  Updated Vital Signs BP 131/72 (BP Location: Right Arm)   Pulse 97   Temp 98.2 F (36.8 C) (Oral)   Resp 20   LMP 09/25/2017   SpO2 97%   Visual Acuity Right Eye Distance:   Left Eye Distance:   Bilateral Distance:    Right Eye Near:   Left Eye Near:    Bilateral Near:     Physical Exam Constitutional:      Appearance: Normal appearance.  HENT:     Head: Normocephalic.     Mouth/Throat:     Comments: Moderate swelling to the bilateral lips, lower larger than upper, no erythema noted Eyes:     Extraocular Movements: Extraocular movements intact.  Pulmonary:     Effort: Pulmonary effort is normal.  Musculoskeletal:     Right lower leg: Edema present.     Left lower leg: Edema present.     Comments: Very mild swelling to the bilateral hands, has full range of motion, 2+ radial pulses bilaterally  2+ dorsalis pedis pulses  Skin:    Comments: Macular rash present to the left thoracic region on the back, nontender  Neurological:     Mental Status: She is alert and oriented to person, place, and time. Mental status is at baseline.      UC Treatments / Results  Labs (all labs ordered are listed, but only abnormal results are displayed) Labs Reviewed - No data to display  EKG   Radiology No results found.  Procedures Procedures (including critical care time)  Medications Ordered in  UC Medications - No data to display  Initial Impression / Assessment and Plan / UC Course  I have reviewed the triage vital signs and the nursing notes.  Pertinent labs & imaging results that were available during my care of the patient were reviewed by me and considered in my medical decision making (see chart for details).  Lip swelling, leg swelling, hand swelling  Vitals are stable, patient no signs of distress nontoxic-appearing, unknown etiology of symptoms,'s swelling significant to the lips and to the feet, appearing mild to the hands, possible reaction to the children's ibuprofen, has taken ibuprofen pills in the past without complication, will move forward with symptomatic management, declined methylprednisolone  injection and prescribed oral prednisone , endorses intolerance to higher dose therefore taper starting at 30 mg, recommended nonpharmacological supportive care and advised follow-up if symptoms persist, for any respiratory involvement patient instructed to go to the nearest emergency department Final Clinical Impressions(s) / UC Diagnoses   Final diagnoses:  Leg swelling  Lip swelling  Hand swelling     Discharge Instructions      Today you are being treated for the the swelling which is a possible reaction to the Children's Ibuprofen which you have taken  You have been given an injection of steroids today in the office today to help reduce the inflammatory process that occurs with this rash which will help  minimize your itching as well as begin to clear  Starting tomorrow take prednisone  every morning with food as directed, to continue the above process  To Reduce swelling to the legs you may elevate whenever sitting or lying on pillows  You may use of topical calamine or Benadryl cream to help manage itching, you may also continue oral Benadryl  Please avoid long exposures to heat such as a hot steamy shower or being outside as this may cause further irritation to  your rash  You may follow-up with his urgent care as needed if symptoms persist or worsen    ED Prescriptions     Medication Sig Dispense Auth. Provider   predniSONE  (DELTASONE ) 10 MG tablet Take 30 mg (3 tablets) every morning for 2 days, then take 20 mg (2 tablets) every morning for 2 days, then take 10 mg (1 tablet) every morning for 1 day 11 tablet Ronalee Scheunemann, Shelba SAUNDERS, NP      PDMP not reviewed this encounter.   Teresa Shelba SAUNDERS, NP 06/10/24 [phone]

## 2024-06-10 NOTE — Discharge Instructions (Addendum)
 Today you are being treated for the the swelling which is a possible reaction to the Children's Ibuprofen which you have taken  You have been given an injection of steroids today in the office today to help reduce the inflammatory process that occurs with this rash which will help minimize your itching as well as begin to clear  Starting tomorrow take prednisone  every morning with food as directed, to continue the above process  To Reduce swelling to the legs you may elevate whenever sitting or lying on pillows  You may use of topical calamine or Benadryl cream to help manage itching, you may also continue oral Benadryl  Please avoid long exposures to heat such as a hot steamy shower or being outside as this may cause further irritation to your rash  You may follow-up with his urgent care as needed if symptoms persist or worsen

## 2024-06-10 NOTE — ED Triage Notes (Signed)
 Patient reports allergic that started at 2 am. Patient reports swollen lips, hands and feet. Patient reports red pain rash middle back. Patient has not taken anything for symptoms.

## 2024-06-15 DIAGNOSIS — F509 Eating disorder, unspecified: Secondary | ICD-10-CM | POA: Diagnosis not present

## 2024-06-15 DIAGNOSIS — F32A Depression, unspecified: Secondary | ICD-10-CM | POA: Diagnosis not present

## 2024-06-15 DIAGNOSIS — C541 Malignant neoplasm of endometrium: Secondary | ICD-10-CM | POA: Diagnosis not present

## 2024-06-15 DIAGNOSIS — D630 Anemia in neoplastic disease: Secondary | ICD-10-CM | POA: Diagnosis not present

## 2024-06-15 DIAGNOSIS — K219 Gastro-esophageal reflux disease without esophagitis: Secondary | ICD-10-CM | POA: Diagnosis not present

## 2024-06-15 DIAGNOSIS — G43909 Migraine, unspecified, not intractable, without status migrainosus: Secondary | ICD-10-CM | POA: Diagnosis not present

## 2024-06-15 DIAGNOSIS — Z791 Long term (current) use of non-steroidal anti-inflammatories (NSAID): Secondary | ICD-10-CM | POA: Diagnosis not present

## 2024-06-15 DIAGNOSIS — Z5982 Transportation insecurity: Secondary | ICD-10-CM | POA: Diagnosis not present

## 2024-06-15 DIAGNOSIS — Z90710 Acquired absence of both cervix and uterus: Secondary | ICD-10-CM | POA: Diagnosis not present

## 2024-06-15 DIAGNOSIS — Z602 Problems related to living alone: Secondary | ICD-10-CM | POA: Diagnosis not present

## 2024-06-15 DIAGNOSIS — Z90722 Acquired absence of ovaries, bilateral: Secondary | ICD-10-CM | POA: Diagnosis not present

## 2024-06-15 DIAGNOSIS — Z483 Aftercare following surgery for neoplasm: Secondary | ICD-10-CM | POA: Diagnosis not present

## 2024-06-15 DIAGNOSIS — Z9049 Acquired absence of other specified parts of digestive tract: Secondary | ICD-10-CM | POA: Diagnosis not present

## 2024-06-15 DIAGNOSIS — F419 Anxiety disorder, unspecified: Secondary | ICD-10-CM | POA: Diagnosis not present

## 2024-06-15 DIAGNOSIS — K589 Irritable bowel syndrome without diarrhea: Secondary | ICD-10-CM | POA: Diagnosis not present

## 2024-06-15 DIAGNOSIS — Z87891 Personal history of nicotine dependence: Secondary | ICD-10-CM | POA: Diagnosis not present

## 2024-06-15 DIAGNOSIS — Z9181 History of falling: Secondary | ICD-10-CM | POA: Diagnosis not present

## 2024-06-20 DIAGNOSIS — K589 Irritable bowel syndrome without diarrhea: Secondary | ICD-10-CM | POA: Diagnosis not present

## 2024-06-20 DIAGNOSIS — Z791 Long term (current) use of non-steroidal anti-inflammatories (NSAID): Secondary | ICD-10-CM | POA: Diagnosis not present

## 2024-06-20 DIAGNOSIS — F509 Eating disorder, unspecified: Secondary | ICD-10-CM | POA: Diagnosis not present

## 2024-06-20 DIAGNOSIS — C541 Malignant neoplasm of endometrium: Secondary | ICD-10-CM | POA: Diagnosis not present

## 2024-06-20 DIAGNOSIS — Z90722 Acquired absence of ovaries, bilateral: Secondary | ICD-10-CM | POA: Diagnosis not present

## 2024-06-20 DIAGNOSIS — F32A Depression, unspecified: Secondary | ICD-10-CM | POA: Diagnosis not present

## 2024-06-20 DIAGNOSIS — F419 Anxiety disorder, unspecified: Secondary | ICD-10-CM | POA: Diagnosis not present

## 2024-06-20 DIAGNOSIS — Z5982 Transportation insecurity: Secondary | ICD-10-CM | POA: Diagnosis not present

## 2024-06-20 DIAGNOSIS — K219 Gastro-esophageal reflux disease without esophagitis: Secondary | ICD-10-CM | POA: Diagnosis not present

## 2024-06-20 DIAGNOSIS — Z87891 Personal history of nicotine dependence: Secondary | ICD-10-CM | POA: Diagnosis not present

## 2024-06-20 DIAGNOSIS — Z602 Problems related to living alone: Secondary | ICD-10-CM | POA: Diagnosis not present

## 2024-06-20 DIAGNOSIS — Z9049 Acquired absence of other specified parts of digestive tract: Secondary | ICD-10-CM | POA: Diagnosis not present

## 2024-06-20 DIAGNOSIS — Z9181 History of falling: Secondary | ICD-10-CM | POA: Diagnosis not present

## 2024-06-20 DIAGNOSIS — D630 Anemia in neoplastic disease: Secondary | ICD-10-CM | POA: Diagnosis not present

## 2024-06-20 DIAGNOSIS — G43909 Migraine, unspecified, not intractable, without status migrainosus: Secondary | ICD-10-CM | POA: Diagnosis not present

## 2024-06-20 DIAGNOSIS — Z483 Aftercare following surgery for neoplasm: Secondary | ICD-10-CM | POA: Diagnosis not present

## 2024-06-20 DIAGNOSIS — Z90710 Acquired absence of both cervix and uterus: Secondary | ICD-10-CM | POA: Diagnosis not present

## 2024-06-30 DIAGNOSIS — S7402XA Injury of sciatic nerve at hip and thigh level, left leg, initial encounter: Secondary | ICD-10-CM | POA: Diagnosis not present

## 2024-06-30 DIAGNOSIS — Z7189 Other specified counseling: Secondary | ICD-10-CM | POA: Diagnosis not present

## 2024-06-30 DIAGNOSIS — L259 Unspecified contact dermatitis, unspecified cause: Secondary | ICD-10-CM | POA: Diagnosis not present

## 2024-06-30 DIAGNOSIS — X58XXXA Exposure to other specified factors, initial encounter: Secondary | ICD-10-CM | POA: Diagnosis not present

## 2024-06-30 DIAGNOSIS — Z79899 Other long term (current) drug therapy: Secondary | ICD-10-CM | POA: Diagnosis not present

## 2024-06-30 DIAGNOSIS — I252 Old myocardial infarction: Secondary | ICD-10-CM | POA: Diagnosis not present

## 2024-06-30 DIAGNOSIS — C541 Malignant neoplasm of endometrium: Secondary | ICD-10-CM | POA: Diagnosis not present

## 2024-07-03 NOTE — Progress Notes (Unsigned)
 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  This was identified on a CT abdomen pelvis from 05/09/2024.  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  Appears the CT scan was obtained by gyn oncology.  Patient denies any prior knowledge of her aneurysm.  She still healing from her surgery on June 01, 2024.  Past Medical History:  Diagnosis Date   allergies    Allergy    Anemia    Anxiety    Blood transfusion without reported diagnosis    Chronic headaches    Depression    Eating disorder    Fatigue    Generalized abdominal pain 01/18/2020   GERD (gastroesophageal reflux disease)    History of chicken pox    Irritable bowel syndrome (IBS)    Migraine    Urine incontinence     Past Surgical History:  Procedure Laterality Date   ABDOMINAL HYSTERECTOMY  06/01/2024   BREAST BIOPSY Left 04/20/2023   Stereo bx, x-clip, path pending   BREAST BIOPSY Left 04/20/2023   stereo bx, coil clip, path pending   BREAST BIOPSY Left 04/20/2023   stereo bx, ribbon clip, path pending   BREAST BIOPSY Left 04/20/2023   MM LT BREAST BX W LOC DEV 1ST LESION IMAGE BX SPEC STEREO GUIDE 04/20/2023 [facility]   BREAST BIOPSY Left 04/20/2023   MM LT BREAST BX W LOC DEV EA AD LESION IMG BX SPEC STEREO GUIDE 04/20/2023 [facility]   BREAST BIOPSY Left 04/20/2023   MM LT BREAST BX W LOC DEV EA AD LESION IMG BX SPEC STEREO GUIDE 04/20/2023 [facility]   BREAST BIOPSY  06/22/2023   MM LT RADIOACTIVE SEED EA ADD LESION LOC MAMMO GUIDE 06/22/2023 [facility]   BREAST BIOPSY  06/22/2023   MM LT RADIOACTIVE SEED EA ADD LESION LOC MAMMO GUIDE 06/22/2023 [facility]   BREAST BIOPSY  06/22/2023   MM LT RADIOACTIVE SEED LOC MAMMO GUIDE 06/22/2023  [facility]   BREAST LUMPECTOMY WITH RADIOACTIVE SEED LOCALIZATION Left 06/24/2023   Procedure: LEFT BREAST RADIOACTIVE SEED LOCALIZED LUMPECTOMY x3;  Surgeon: Curvin Deward MOULD, MD;  Location: [facility];  Service: General;  Laterality: Left;   CESAREAN SECTION  10/12/1994   CHOLECYSTECTOMY N/A 01/21/2019   Procedure: LAPAROSCOPIC CHOLECYSTECTOMY -No grams;  Surgeon: Jordis Laneta FALCON, MD;  Location: [facility];  Service: General;  Laterality: N/A;   Dental Implant  10/13/2011   DILATION AND CURETTAGE OF UTERUS     x2 Miscarriages 1994 & 1995    Family History  Problem Relation Age of Onset   Heart disease Father 67   Atrial fibrillation Father    Prostate cancer Maternal Uncle    Heart disease Paternal Aunt    Kidney cancer Maternal Grandmother    Lung cancer Maternal Grandfather    Heart disease Paternal Grandmother    Lung cancer Paternal Grandmother    Stroke Paternal Grandmother    Lung cancer Paternal Grandfather    Breast cancer Neg Hx    Colon cancer Neg Hx    Pancreatic cancer Neg Hx    Ovarian cancer Neg Hx    Endometrial cancer Neg Hx     SOCIAL HISTORY: Social History   Socioeconomic History   Marital status: Divorced  Spouse name: Not on file   Number of children: 2   Years of education: Not on file   Highest education level: Not on file  Occupational History   Occupation: Management and Program Analyst    Employer: [employer]  Tobacco Use   Smoking status: Former    Current packs/day: 1.00    Average packs/day: 1 pack/day for 12.0 years (12.0 ttl pk-yrs)    Types: Cigarettes   Smokeless tobacco: Never  Vaping Use   Vaping status: Never Used  Substance and Sexual Activity   Alcohol use: No   Drug use: No   Sexual activity: Not Currently    Partners: Male  Other Topics Concern   Not on file  Social History Narrative   Divorced, twins age 68   Diet: moderate, water   Exercise: walks sporadically   Social Drivers of Manufacturing engineer Strain: Low Risk  (06/02/2024)   Received from [facility]   Overall Financial Resource Strain (CARDIA)    How hard is it for you to pay for the very basics like food, housing, medical care, and heating?: Not hard at all  Food Insecurity: No Food Insecurity (06/02/2024)   Received from [facility]   Hunger Vital Sign    Within the past 12 months, you worried that your food would run out before you got the money to buy more.: Never true    Within the past 12 months, the food you bought just didn't last and you didn't have money to get more.: Never true  Transportation Needs: No Transportation Needs (06/02/2024)   Received from [facility]   PRAPARE - Transportation    Lack of Transportation (Medical): No    Lack of Transportation (Non-Medical): No  Physical Activity: Not on file  Stress: Not on file  Social Connections: Not on file  Intimate Partner Violence: Not on file    Allergies  Allergen Reactions   Zithromax [Azithromycin] Diarrhea   Penicillins Rash    Did it involve swelling of the face/tongue/throat, SOB, or low BP? No Did it involve sudden or severe rash/hives, skin peeling, or any reaction on the inside of your mouth or nose? YES Did you need to seek medical attention at a hospital or doctor's office? No When did it last happen?       If all above answers are "NO", may proceed with cephalosporin use.    Current Outpatient Medications  Medication Sig Dispense Refill   famotidine  (PEPCID ) 20 MG tablet Take 20 mg by mouth 2 (two) times daily.     fluticasone (FLONASE) 50 MCG/ACT nasal spray Place 2 sprays into both nostrils daily as needed for allergies or rhinitis.     loratadine (CLARITIN) 10 MG tablet Take 10 mg by mouth daily as needed for allergies.     Multiple Vitamin (MULTIVITAMIN) tablet Take 1 tablet by mouth daily.     Multiple Vitamins-Minerals (PRESERVISION AREDS PO) Take 1 tablet by mouth daily.     predniSONE  (DELTASONE ) 10  MG tablet Take 30 mg (3 tablets) every morning for 2 days, then take 20 mg (2 tablets) every morning for 2 days, then take 10 mg (1 tablet) every morning for 1 day 11 tablet 0   No current facility-administered medications for this visit.    REVIEW OF SYSTEMS:  [X]  denotes positive finding, [ ]  denotes negative finding Cardiac  Comments:  Chest pain or chest pressure:    Shortness of breath upon  exertion:    Short of breath when lying flat:    Irregular heart rhythm:        Vascular    Pain in calf, thigh, or hip brought on by ambulation:    Pain in feet at night that wakes you up from your sleep:     Blood clot in your veins:    Leg swelling:         Pulmonary    Oxygen at home:    Productive cough:     Wheezing:         Neurologic    Sudden weakness in arms or legs:     Sudden numbness in arms or legs:     Sudden onset of difficulty speaking or slurred speech:    Temporary loss of vision in one eye:     Problems with dizziness:         Gastrointestinal    Blood in stool:     Vomited blood:         Genitourinary    Burning when urinating:     Blood in urine:        Psychiatric    Major depression:         Hematologic    Bleeding problems:    Problems with blood clotting too easily:        Skin    Rashes or ulcers:        Constitutional    Fever or chills:      PHYSICAL EXAM: There were no vitals filed for this visit.  GENERAL: The patient is a well-nourished female, in no acute distress. The vital signs are documented above. CARDIAC: There is a regular rate and rhythm.  VASCULAR: Palpable radial pulses bilaterally Right DP palpable PULMONARY: No respiratory distress. ABDOMEN: Soft. MUSCULOSKELETAL: There are no major deformities or cyanosis. NEUROLOGIC: No focal weakness or paresthesias are detected. SKIN: There are no ulcers or rashes noted. PSYCHIATRIC: The patient has a normal affect.  DATA:   CT abdomen pelvis reviewed from 05/09/2024 with 14  mm calcified splenic artery aneurysm and another smaller likely calcified aneurysm in the hilum.  Assessment/Plan:  62 y.o. 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  This was identified on a CT abdomen pelvis from 05/09/2024.  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  Likely a second smaller aneurysm in the hilum.  I reviewed her CT and discussed there is no indication for surgical repair at this time.  I discussed that we recommend repair greater than 3 cm or women of childbearing age.  At 1.4 cm this is safe for observation.  Will repeat imaging in 1 year with repeat CT for ongoing surveillance.   Lonni DOROTHA Gaskins, MD [facility] Office: [phone]

## 2024-07-04 ENCOUNTER — Encounter: Payer: Self-pay | Admitting: Vascular Surgery

## 2024-07-04 ENCOUNTER — Ambulatory Visit: Attending: Vascular Surgery | Admitting: Vascular Surgery

## 2024-07-04 VITALS — BP 144/79 | HR 84 | Temp 98.7°F | Resp 20 | Ht 67.0 in | Wt 257.2 lb

## 2024-07-04 DIAGNOSIS — I728 Aneurysm of other specified arteries: Secondary | ICD-10-CM | POA: Insufficient documentation

## 2024-07-05 DIAGNOSIS — F419 Anxiety disorder, unspecified: Secondary | ICD-10-CM | POA: Diagnosis not present

## 2024-07-05 DIAGNOSIS — Z791 Long term (current) use of non-steroidal anti-inflammatories (NSAID): Secondary | ICD-10-CM | POA: Diagnosis not present

## 2024-07-05 DIAGNOSIS — Z87891 Personal history of nicotine dependence: Secondary | ICD-10-CM | POA: Diagnosis not present

## 2024-07-05 DIAGNOSIS — K589 Irritable bowel syndrome without diarrhea: Secondary | ICD-10-CM | POA: Diagnosis not present

## 2024-07-05 DIAGNOSIS — F32A Depression, unspecified: Secondary | ICD-10-CM | POA: Diagnosis not present

## 2024-07-05 DIAGNOSIS — G43909 Migraine, unspecified, not intractable, without status migrainosus: Secondary | ICD-10-CM | POA: Diagnosis not present

## 2024-07-05 DIAGNOSIS — Z483 Aftercare following surgery for neoplasm: Secondary | ICD-10-CM | POA: Diagnosis not present

## 2024-07-05 DIAGNOSIS — Z5982 Transportation insecurity: Secondary | ICD-10-CM | POA: Diagnosis not present

## 2024-07-05 DIAGNOSIS — Z9181 History of falling: Secondary | ICD-10-CM | POA: Diagnosis not present

## 2024-07-05 DIAGNOSIS — K219 Gastro-esophageal reflux disease without esophagitis: Secondary | ICD-10-CM | POA: Diagnosis not present

## 2024-07-05 DIAGNOSIS — Z9049 Acquired absence of other specified parts of digestive tract: Secondary | ICD-10-CM | POA: Diagnosis not present

## 2024-07-05 DIAGNOSIS — Z90710 Acquired absence of both cervix and uterus: Secondary | ICD-10-CM | POA: Diagnosis not present

## 2024-07-05 DIAGNOSIS — D630 Anemia in neoplastic disease: Secondary | ICD-10-CM | POA: Diagnosis not present

## 2024-07-05 DIAGNOSIS — Z602 Problems related to living alone: Secondary | ICD-10-CM | POA: Diagnosis not present

## 2024-07-05 DIAGNOSIS — Z90722 Acquired absence of ovaries, bilateral: Secondary | ICD-10-CM | POA: Diagnosis not present

## 2024-07-05 DIAGNOSIS — F509 Eating disorder, unspecified: Secondary | ICD-10-CM | POA: Diagnosis not present

## 2024-07-05 DIAGNOSIS — C541 Malignant neoplasm of endometrium: Secondary | ICD-10-CM | POA: Diagnosis not present

## 2024-07-13 DIAGNOSIS — F419 Anxiety disorder, unspecified: Secondary | ICD-10-CM | POA: Diagnosis not present

## 2024-07-13 DIAGNOSIS — F32A Depression, unspecified: Secondary | ICD-10-CM | POA: Diagnosis not present

## 2024-07-13 DIAGNOSIS — Z9181 History of falling: Secondary | ICD-10-CM | POA: Diagnosis not present

## 2024-07-13 DIAGNOSIS — Z483 Aftercare following surgery for neoplasm: Secondary | ICD-10-CM | POA: Diagnosis not present

## 2024-07-13 DIAGNOSIS — F509 Eating disorder, unspecified: Secondary | ICD-10-CM | POA: Diagnosis not present

## 2024-07-13 DIAGNOSIS — C541 Malignant neoplasm of endometrium: Secondary | ICD-10-CM | POA: Diagnosis not present

## 2024-07-13 DIAGNOSIS — Z9049 Acquired absence of other specified parts of digestive tract: Secondary | ICD-10-CM | POA: Diagnosis not present

## 2024-07-13 DIAGNOSIS — K219 Gastro-esophageal reflux disease without esophagitis: Secondary | ICD-10-CM | POA: Diagnosis not present

## 2024-07-13 DIAGNOSIS — Z602 Problems related to living alone: Secondary | ICD-10-CM | POA: Diagnosis not present

## 2024-07-13 DIAGNOSIS — G43909 Migraine, unspecified, not intractable, without status migrainosus: Secondary | ICD-10-CM | POA: Diagnosis not present

## 2024-07-13 DIAGNOSIS — Z791 Long term (current) use of non-steroidal anti-inflammatories (NSAID): Secondary | ICD-10-CM | POA: Diagnosis not present

## 2024-07-13 DIAGNOSIS — K589 Irritable bowel syndrome without diarrhea: Secondary | ICD-10-CM | POA: Diagnosis not present

## 2024-07-13 DIAGNOSIS — Z5982 Transportation insecurity: Secondary | ICD-10-CM | POA: Diagnosis not present

## 2024-07-13 DIAGNOSIS — Z87891 Personal history of nicotine dependence: Secondary | ICD-10-CM | POA: Diagnosis not present

## 2024-07-13 DIAGNOSIS — Z90722 Acquired absence of ovaries, bilateral: Secondary | ICD-10-CM | POA: Diagnosis not present

## 2024-07-13 DIAGNOSIS — Z90710 Acquired absence of both cervix and uterus: Secondary | ICD-10-CM | POA: Diagnosis not present

## 2024-07-13 DIAGNOSIS — D630 Anemia in neoplastic disease: Secondary | ICD-10-CM | POA: Diagnosis not present

## 2024-07-20 ENCOUNTER — Encounter: Payer: Self-pay | Admitting: Psychiatry

## 2024-08-04 ENCOUNTER — Ambulatory Visit: Payer: Federal, State, Local not specified - PPO | Admitting: Hematology and Oncology

## 2024-08-22 ENCOUNTER — Inpatient Hospital Stay: Admission: RE | Admit: 2024-08-22 | Source: Ambulatory Visit

## 2024-08-25 ENCOUNTER — Ambulatory Visit: Admitting: Hematology and Oncology

## 2024-09-24 ENCOUNTER — Ambulatory Visit
Admission: RE | Admit: 2024-09-24 | Discharge: 2024-09-24 | Disposition: A | Discharge status: Home / Self Care | Source: Ambulatory Visit | Attending: Hematology and Oncology | Admitting: Hematology and Oncology

## 2024-09-24 DIAGNOSIS — N6089 Other benign mammary dysplasias of unspecified breast: Secondary | ICD-10-CM | POA: Diagnosis not present

## 2024-09-24 DIAGNOSIS — R928 Other abnormal and inconclusive findings on diagnostic imaging of breast: Secondary | ICD-10-CM

## 2024-09-24 MED ORDER — GADOPICLENOL 0.5 MMOL/ML IV SOLN
10.0000 mL | Freq: Once | INTRAVENOUS | Status: AC | PRN
  Administered 2024-09-24: 12:00:00 10 mL via INTRAVENOUS

## 2024-09-28 ENCOUNTER — Other Ambulatory Visit: Payer: Self-pay | Admitting: Hematology and Oncology

## 2024-09-28 DIAGNOSIS — Z9189 Other specified personal risk factors, not elsewhere classified: Secondary | ICD-10-CM

## 2024-09-29 ENCOUNTER — Inpatient Hospital Stay: Admitting: Hematology and Oncology

## 2024-09-29 ENCOUNTER — Telehealth: Payer: Self-pay

## 2024-09-29 ENCOUNTER — Inpatient Hospital Stay: Attending: Hematology and Oncology | Admitting: Hematology and Oncology

## 2024-09-29 VITALS — BP 167/68 | HR 89 | Temp 98.1°F | Resp 17 | Wt 264.4 lb

## 2024-09-29 DIAGNOSIS — Z801 Family history of malignant neoplasm of trachea, bronchus and lung: Secondary | ICD-10-CM | POA: Diagnosis not present

## 2024-09-29 DIAGNOSIS — Z90710 Acquired absence of both cervix and uterus: Secondary | ICD-10-CM | POA: Diagnosis not present

## 2024-09-29 DIAGNOSIS — Z8042 Family history of malignant neoplasm of prostate: Secondary | ICD-10-CM | POA: Insufficient documentation

## 2024-09-29 DIAGNOSIS — C541 Malignant neoplasm of endometrium: Secondary | ICD-10-CM | POA: Diagnosis not present

## 2024-09-29 DIAGNOSIS — Z8051 Family history of malignant neoplasm of kidney: Secondary | ICD-10-CM | POA: Diagnosis not present

## 2024-09-29 DIAGNOSIS — Z9189 Other specified personal risk factors, not elsewhere classified: Secondary | ICD-10-CM

## 2024-09-29 DIAGNOSIS — N6092 Unspecified benign mammary dysplasia of left breast: Secondary | ICD-10-CM | POA: Diagnosis not present

## 2024-09-29 DIAGNOSIS — Z87891 Personal history of nicotine dependence: Secondary | ICD-10-CM | POA: Insufficient documentation

## 2024-09-29 NOTE — Progress Notes (Signed)
 [facility] CONSULT NOTE  Patient Care Team: Gretta Comer POUR, NP as PCP - General (Internal Medicine) Gorge Ade, MD as Consulting Physician (Obstetrics and Gynecology)  CHIEF COMPLAINTS/PURPOSE OF CONSULTATION:  Newly diagnosed breast cancer  HISTORY OF PRESENTING ILLNESS:  Lori Kaiser 63 y.o. female is here because of recent diagnosis of left breast ADH  Patient is here for telephone follow-up since her diagnosis of atypical lobular hyperplasia and noted increased risk of developing breast cancer.   Discussed the use of AI scribe software for clinical note transcription with the patient, who gave verbal consent to proceed.  History of Present Illness Lori Kaiser is a 63 year old female with history of ADH,endometrial adenocarcinoma, status post hysterectomy, presenting for oncology follow-up and breast cancer screening surveillance.  She was diagnosed with grade 2 endometrial adenocarcinoma in August 2025 following abnormal uterine bleeding and subsequently underwent complete hysterectomy with negative lymph node sampling. She remains under surveillance with her gynecologic oncologist and has not experienced recurrence of abnormal bleeding. She reports persistent nerve damage post-hysterectomy but otherwise endorses good gastrointestinal health and no new systemic symptoms.  She is undergoing intensive breast cancer screening due to risk factors, alternating breast MRI and mammogram. She declined tamoxifen therapy due to her endometrial cancer history. Her most recent breast MRI in December 2025 showed an area of fat necrosis in the upper right breast, which has been seen on prior imaging. She has not noted new breast symptoms or palpable changes on self-examination. No biopsy has been performed. She is currently due for a mammogram to reestablish baseline imaging.    MEDICAL HISTORY:  Past Medical History:  Diagnosis Date   allergies    Allergy     Anemia    Anxiety    Blood transfusion without reported diagnosis    Chronic headaches    Depression    Eating disorder    Fatigue    Generalized abdominal pain 01/18/2020   GERD (gastroesophageal reflux disease)    History of chicken pox    Irritable bowel syndrome (IBS)    Migraine    Urine incontinence     SURGICAL HISTORY: Past Surgical History:  Procedure Laterality Date   ABDOMINAL HYSTERECTOMY  06/01/2024   BREAST BIOPSY Left 04/20/2023   Stereo bx, x-clip, path pending   BREAST BIOPSY Left 04/20/2023   stereo bx, coil clip, path pending   BREAST BIOPSY Left 04/20/2023   stereo bx, ribbon clip, path pending   BREAST BIOPSY Left 04/20/2023   MM LT BREAST BX W LOC DEV 1ST LESION IMAGE BX SPEC STEREO GUIDE 04/20/2023 [facility]   BREAST BIOPSY Left 04/20/2023   MM LT BREAST BX W LOC DEV EA AD LESION IMG BX SPEC STEREO GUIDE 04/20/2023 [facility]   BREAST BIOPSY Left 04/20/2023   MM LT BREAST BX W LOC DEV EA AD LESION IMG BX SPEC STEREO GUIDE 04/20/2023 [facility]   BREAST BIOPSY  06/22/2023   MM LT RADIOACTIVE SEED EA ADD LESION LOC MAMMO GUIDE 06/22/2023 [facility]   BREAST BIOPSY  06/22/2023   MM LT RADIOACTIVE SEED EA ADD LESION LOC MAMMO GUIDE 06/22/2023 [facility]   BREAST BIOPSY  06/22/2023   MM LT RADIOACTIVE SEED LOC MAMMO GUIDE 06/22/2023 [facility]   BREAST LUMPECTOMY WITH RADIOACTIVE SEED LOCALIZATION Left 06/24/2023   Procedure: LEFT BREAST RADIOACTIVE SEED LOCALIZED LUMPECTOMY x3;  Surgeon: Curvin Deward MOULD, MD;  Location: [facility];  Service: General;  Laterality: Left;   CESAREAN  SECTION  10/12/1994   CHOLECYSTECTOMY N/A 01/21/2019   Procedure: LAPAROSCOPIC CHOLECYSTECTOMY -No grams;  Surgeon: Jordis Laneta FALCON, MD;  Location: [facility];  Service: General;  Laterality: N/A;   Dental Implant  10/13/2011   DILATION AND CURETTAGE OF UTERUS     x2 Miscarriages 1994 & 1995    SOCIAL HISTORY: Social History    Socioeconomic History   Marital status: Divorced    Spouse name: Not on file   Number of children: 2   Years of education: Not on file   Highest education level: Not on file  Occupational History   Occupation: Management and Program Analyst    Employer: [employer]  Tobacco Use   Smoking status: Former    Current packs/day: 1.00    Average packs/day: 1 pack/day for 12.0 years (12.0 ttl pk-yrs)    Types: Cigarettes   Smokeless tobacco: Never  Vaping Use   Vaping status: Never Used  Substance and Sexual Activity   Alcohol use: No   Drug use: No   Sexual activity: Not Currently    Partners: Male  Other Topics Concern   Not on file  Social History Narrative   Divorced, twins age 89   Diet: moderate, water   Exercise: walks sporadically   Social Drivers of Health   Tobacco Use: Medium Risk (07/05/2024)   Received from [facility]   Patient History    Smoking Tobacco Use: Former    Smokeless Tobacco Use: Never    Passive Exposure: Not on file  Financial Resource Strain: Low Risk (06/02/2024)   Received from [facility]   Overall Financial Resource Strain (CARDIA)    How hard is it for you to pay for the very basics like food, housing, medical care, and heating?: Not hard at all  Food Insecurity: No Food Insecurity (06/02/2024)   Received from [facility]   Epic    Within the past 12 months, you worried that your food would run out before you got the money to buy more.: Never true    Within the past 12 months, the food you bought just didn't last and you didn't have money to get more.: Never true  Transportation Needs: No Transportation Needs (06/02/2024)   Received from [facility]    Lack of Transportation (Medical): No    Lack of Transportation (Non-Medical): No  Physical Activity: Not on file  Stress: Not on file  Social Connections: Not on file  Intimate Partner Violence: Not on file  Depression (PHQ2-9): Medium Risk (12/18/2022)    Depression (PHQ2-9)    PHQ-2 Score: 5  Alcohol Screen: Not on file  Housing: Not on file  Utilities: Low Risk (06/02/2024)   Received from [facility]   Utilities    Within the past 12 months, have you been unable to get utilities(heat, electricity) when it was really needed?: No  Health Literacy: Not on file    FAMILY HISTORY: Family History  Problem Relation Age of Onset   Heart disease Father 71   Atrial fibrillation Father    Prostate cancer Maternal Uncle    Heart disease Paternal Aunt    Kidney cancer Maternal Grandmother    Lung cancer Maternal Grandfather    Heart disease Paternal Grandmother    Lung cancer Paternal Grandmother    Stroke Paternal Grandmother    Lung cancer Paternal Grandfather    Breast cancer Neg Hx    Colon cancer Neg Hx  Pancreatic cancer Neg Hx    Ovarian cancer Neg Hx    Endometrial cancer Neg Hx     ALLERGIES:  is allergic to zithromax [azithromycin] and penicillins.  MEDICATIONS:  Current Outpatient Medications  Medication Sig Dispense Refill   famotidine  (PEPCID ) 20 MG tablet Take 20 mg by mouth 2 (two) times daily.     fluticasone (FLONASE) 50 MCG/ACT nasal spray Place 2 sprays into both nostrils daily as needed for allergies or rhinitis.     loratadine (CLARITIN) 10 MG tablet Take 10 mg by mouth daily as needed for allergies.     Multiple Vitamin (MULTIVITAMIN) tablet Take 1 tablet by mouth daily.     Multiple Vitamins-Minerals (PRESERVISION AREDS PO) Take 1 tablet by mouth daily.     predniSONE  (DELTASONE ) 10 MG tablet Take 30 mg (3 tablets) every morning for 2 days, then take 20 mg (2 tablets) every morning for 2 days, then take 10 mg (1 tablet) every morning for 1 day 11 tablet 0   No current facility-administered medications for this visit.    REVIEW OF SYSTEMS:   Constitutional: Denies fevers, chills or abnormal night sweats Eyes: Denies blurriness of vision, double vision or watery eyes Ears, nose, mouth, throat, and  face: Denies mucositis or sore throat Respiratory: Denies cough, dyspnea or wheezes Cardiovascular: Denies palpitation, chest discomfort or lower extremity swelling Gastrointestinal:  Denies nausea, heartburn or change in bowel habits Skin: Denies abnormal skin rashes Lymphatics: Denies new lymphadenopathy or easy bruising Neurological:Denies numbness, tingling or new weaknesses Behavioral/Psych: Mood is stable, no new changes  Breast: Denies any palpable lumps or discharge All other systems were reviewed with the patient and are negative.  PHYSICAL EXAMINATION: ECOG PERFORMANCE STATUS: 0 - Asymptomatic  BP (!) 167/68 (BP Location: Left Arm, Patient Position: Sitting)   Pulse 89   Temp 98.1 F (36.7 C)   Resp 17   Wt 264 lb 6.4 oz (119.9 kg)   LMP 09/25/2017   SpO2 97%   BMI 41.41 kg/m    She appears well. No distress No concerns on exam. No regional adenopathy CTA bilaterally RRR No LE edema.  LABORATORY DATA:  I have reviewed the data as listed Lab Results  Component Value Date   WBC 8.2 12/18/2022   HGB 13.8 12/18/2022   HCT 41.1 12/18/2022   MCV 88.3 12/18/2022   PLT 298.0 12/18/2022   Lab Results  Component Value Date   NA 139 12/18/2022   K 4.0 12/18/2022   CL 101 12/18/2022   CO2 29 12/18/2022    RADIOGRAPHIC STUDIES: I have personally reviewed the radiological reports and agreed with the findings in the report.  ASSESSMENT AND PLAN:   Pathology review: I discussed the difference between atypical ductal hyperplasia, DCIS and invasive breast cancer. I explained to her that atypical ductal hyperplasia  is characterized by a proliferation of uniform epithelial cells filling part, but not the entirety, of the involved duct. ADH is associated with an increased risk of both ipsilateral and contralateral breast cancer and thus provides evidence of underlying breast abnormalities that predispose to breast cancer.   Prognosis:Using the TC model calculated,  life time risk of BC is about 29.2% Assessment & Plan Endometrial adenocarcinoma post-hysterectomy Status post total hysterectomy for grade 2 endometrial adenocarcinoma with negative lymph nodes. Reports postoperative nerve damage. Stable under surveillance. - Continue surveillance with gynecologic oncology every six months for two years, then annually.  Atypical Lobular Hyperplasia Post-lumpectomy with no malignancy detected. Discussed the  increased risk of breast cancer and the potential benefits of tamoxifen for risk reduction.  She declined adj tamoxifen  Breast Cancer Risk Lifetime risk calculated at 29.2%, qualifying patient for MRI screening in addition to mammograms. Discussed the benefits and potential drawbacks of MRI screening. -Continue annual mammograms and MRI's She is overdue for mammogram, wants to do in Jun to go back on schedule SBE every month PE by a provider approximately every 6 months.  RTC in 1 yr.  All questions were answered. The patient knows to call the clinic with any problems, questions or concerns.    Amber Stalls, MD 09/29/2024

## 2024-09-29 NOTE — Telephone Encounter (Signed)
 Spoke with patient and moved appointment time from 11:45-11:15am.. patient confirmed and understood

## 2025-04-06 ENCOUNTER — Ambulatory Visit

## 2025-09-28 ENCOUNTER — Inpatient Hospital Stay: Admitting: Hematology and Oncology
